# Patient Record
Sex: Male | Born: 1935 | Race: Black or African American | Hispanic: No | Marital: Married | Smoking: Former smoker
Health system: Southern US, Community
[De-identification: ages and names within clinical notes are randomized; demographics above are authoritative.]

## PROBLEM LIST (undated history)

## (undated) DIAGNOSIS — E119 Type 2 diabetes mellitus without complications: Secondary | ICD-10-CM

## (undated) DIAGNOSIS — I509 Heart failure, unspecified: Secondary | ICD-10-CM

## (undated) DIAGNOSIS — D649 Anemia, unspecified: Secondary | ICD-10-CM

## (undated) DIAGNOSIS — K635 Polyp of colon: Secondary | ICD-10-CM

## (undated) DIAGNOSIS — J9610 Chronic respiratory failure, unspecified whether with hypoxia or hypercapnia: Secondary | ICD-10-CM

## (undated) DIAGNOSIS — J449 Chronic obstructive pulmonary disease, unspecified: Secondary | ICD-10-CM

## (undated) HISTORY — PX: COLONOSCOPY: SHX174

---

## 2014-03-11 ENCOUNTER — Encounter (HOSPITAL_COMMUNITY): Payer: Self-pay | Admitting: Pulmonary Disease

## 2014-03-11 ENCOUNTER — Inpatient Hospital Stay (HOSPITAL_COMMUNITY): Payer: Medicare Other

## 2014-03-11 ENCOUNTER — Inpatient Hospital Stay (HOSPITAL_COMMUNITY)
Admission: EM | Admit: 2014-03-11 | Discharge: 2014-03-19 | DRG: 208 | Disposition: A | Discharge status: Home Health | Payer: Medicare Other | Source: Other Acute Inpatient Hospital | Attending: Internal Medicine | Admitting: Internal Medicine

## 2014-03-11 DIAGNOSIS — K219 Gastro-esophageal reflux disease without esophagitis: Secondary | ICD-10-CM | POA: Diagnosis present

## 2014-03-11 DIAGNOSIS — I129 Hypertensive chronic kidney disease with stage 1 through stage 4 chronic kidney disease, or unspecified chronic kidney disease: Secondary | ICD-10-CM | POA: Diagnosis present

## 2014-03-11 DIAGNOSIS — I5032 Chronic diastolic (congestive) heart failure: Secondary | ICD-10-CM | POA: Diagnosis present

## 2014-03-11 DIAGNOSIS — E876 Hypokalemia: Secondary | ICD-10-CM | POA: Diagnosis present

## 2014-03-11 DIAGNOSIS — E118 Type 2 diabetes mellitus with unspecified complications: Secondary | ICD-10-CM

## 2014-03-11 DIAGNOSIS — E785 Hyperlipidemia, unspecified: Secondary | ICD-10-CM | POA: Diagnosis present

## 2014-03-11 DIAGNOSIS — E662 Morbid (severe) obesity with alveolar hypoventilation: Secondary | ICD-10-CM | POA: Diagnosis present

## 2014-03-11 DIAGNOSIS — E119 Type 2 diabetes mellitus without complications: Secondary | ICD-10-CM

## 2014-03-11 DIAGNOSIS — E861 Hypovolemia: Secondary | ICD-10-CM | POA: Diagnosis present

## 2014-03-11 DIAGNOSIS — Z87891 Personal history of nicotine dependence: Secondary | ICD-10-CM

## 2014-03-11 DIAGNOSIS — J40 Bronchitis, not specified as acute or chronic: Secondary | ICD-10-CM | POA: Diagnosis present

## 2014-03-11 DIAGNOSIS — E1122 Type 2 diabetes mellitus with diabetic chronic kidney disease: Secondary | ICD-10-CM

## 2014-03-11 DIAGNOSIS — N472 Paraphimosis: Secondary | ICD-10-CM | POA: Diagnosis present

## 2014-03-11 DIAGNOSIS — M109 Gout, unspecified: Secondary | ICD-10-CM | POA: Diagnosis present

## 2014-03-11 DIAGNOSIS — Z9981 Dependence on supplemental oxygen: Secondary | ICD-10-CM | POA: Diagnosis not present

## 2014-03-11 DIAGNOSIS — G629 Polyneuropathy, unspecified: Secondary | ICD-10-CM | POA: Diagnosis present

## 2014-03-11 DIAGNOSIS — G9340 Encephalopathy, unspecified: Secondary | ICD-10-CM | POA: Diagnosis present

## 2014-03-11 DIAGNOSIS — D649 Anemia, unspecified: Secondary | ICD-10-CM | POA: Diagnosis present

## 2014-03-11 DIAGNOSIS — G4733 Obstructive sleep apnea (adult) (pediatric): Secondary | ICD-10-CM | POA: Diagnosis present

## 2014-03-11 DIAGNOSIS — J449 Chronic obstructive pulmonary disease, unspecified: Secondary | ICD-10-CM

## 2014-03-11 DIAGNOSIS — R0602 Shortness of breath: Secondary | ICD-10-CM | POA: Diagnosis present

## 2014-03-11 DIAGNOSIS — N189 Chronic kidney disease, unspecified: Secondary | ICD-10-CM | POA: Diagnosis present

## 2014-03-11 DIAGNOSIS — G9341 Metabolic encephalopathy: Secondary | ICD-10-CM | POA: Diagnosis present

## 2014-03-11 DIAGNOSIS — J9620 Acute and chronic respiratory failure, unspecified whether with hypoxia or hypercapnia: Secondary | ICD-10-CM

## 2014-03-11 DIAGNOSIS — E1140 Type 2 diabetes mellitus with diabetic neuropathy, unspecified: Secondary | ICD-10-CM | POA: Diagnosis present

## 2014-03-11 DIAGNOSIS — R5381 Other malaise: Secondary | ICD-10-CM

## 2014-03-11 DIAGNOSIS — I509 Heart failure, unspecified: Secondary | ICD-10-CM

## 2014-03-11 DIAGNOSIS — J9621 Acute and chronic respiratory failure with hypoxia: Secondary | ICD-10-CM | POA: Diagnosis present

## 2014-03-11 HISTORY — DX: Type 2 diabetes mellitus without complications: E11.9

## 2014-03-11 HISTORY — DX: Anemia, unspecified: D64.9

## 2014-03-11 HISTORY — DX: Heart failure, unspecified: I50.9

## 2014-03-11 HISTORY — DX: Chronic obstructive pulmonary disease, unspecified: J44.9

## 2014-03-11 HISTORY — DX: Polyp of colon: K63.5

## 2014-03-11 HISTORY — DX: Chronic respiratory failure, unspecified whether with hypoxia or hypercapnia: J96.10

## 2014-03-11 LAB — URINALYSIS, ROUTINE W REFLEX MICROSCOPIC
Bilirubin Urine: NEGATIVE
Glucose, UA: NEGATIVE mg/dL
HGB URINE DIPSTICK: NEGATIVE
Ketones, ur: NEGATIVE mg/dL
Leukocytes, UA: NEGATIVE
NITRITE: NEGATIVE
PH: 8 (ref 5.0–8.0)
Protein, ur: NEGATIVE mg/dL
SPECIFIC GRAVITY, URINE: 1.009 (ref 1.005–1.030)
Urobilinogen, UA: 1 mg/dL (ref 0.0–1.0)

## 2014-03-11 LAB — COMPREHENSIVE METABOLIC PANEL
ALT: 5 U/L (ref 0–53)
AST: 15 U/L (ref 0–37)
Albumin: 2.6 g/dL — ABNORMAL LOW (ref 3.5–5.2)
Alkaline Phosphatase: 94 U/L (ref 39–117)
Anion gap: 11 (ref 5–15)
BUN: 25 mg/dL — ABNORMAL HIGH (ref 6–23)
CO2: 33 mEq/L — ABNORMAL HIGH (ref 19–32)
Calcium: 8.9 mg/dL (ref 8.4–10.5)
Chloride: 100 mEq/L (ref 96–112)
Creatinine, Ser: 2.68 mg/dL — ABNORMAL HIGH (ref 0.50–1.35)
GFR calc Af Amer: 25 mL/min — ABNORMAL LOW (ref >90)
GFR calc non Af Amer: 22 mL/min — ABNORMAL LOW (ref >90)
Glucose, Bld: 90 mg/dL (ref 70–99)
Potassium: 3.1 mEq/L — ABNORMAL LOW (ref 3.7–5.3)
Sodium: 144 mEq/L (ref 137–147)
Total Bilirubin: 0.4 mg/dL (ref 0.3–1.2)
Total Protein: 7.3 g/dL (ref 6.0–8.3)

## 2014-03-11 LAB — POCT I-STAT 3, ART BLOOD GAS (G3+)
Acid-Base Excess: 11 mmol/L — ABNORMAL HIGH (ref 0.0–2.0)
BICARBONATE: 35 meq/L — AB (ref 20.0–24.0)
O2 Saturation: 95 %
PCO2 ART: 43.1 mmHg (ref 35.0–45.0)
PH ART: 7.515 — AB (ref 7.350–7.450)
TCO2: 36 mmol/L (ref 0–100)
pO2, Arterial: 67 mmHg — ABNORMAL LOW (ref 80.0–100.0)

## 2014-03-11 LAB — PHOSPHORUS
PHOSPHORUS: 1.9 mg/dL — AB (ref 2.3–4.6)
PHOSPHORUS: 1.6 mg/dL — AB (ref 2.3–4.6)

## 2014-03-11 LAB — BASIC METABOLIC PANEL
Anion gap: 10 (ref 5–15)
BUN: 24 mg/dL — AB (ref 6–23)
CALCIUM: 8.5 mg/dL (ref 8.4–10.5)
CO2: 32 mEq/L (ref 19–32)
Chloride: 101 mEq/L (ref 96–112)
Creatinine, Ser: 2.45 mg/dL — ABNORMAL HIGH (ref 0.50–1.35)
GFR, EST AFRICAN AMERICAN: 28 mL/min — AB (ref >90)
GFR, EST NON AFRICAN AMERICAN: 24 mL/min — AB (ref >90)
Glucose, Bld: 116 mg/dL — ABNORMAL HIGH (ref 70–99)
Potassium: 3 mEq/L — ABNORMAL LOW (ref 3.7–5.3)
SODIUM: 143 meq/L (ref 137–147)

## 2014-03-11 LAB — MRSA PCR SCREENING: MRSA by PCR: NEGATIVE

## 2014-03-11 LAB — CBC
HCT: 27.6 % — ABNORMAL LOW (ref 39.0–52.0)
Hemoglobin: 7.7 g/dL — ABNORMAL LOW (ref 13.0–17.0)
MCH: 26.4 pg (ref 26.0–34.0)
MCHC: 27.9 g/dL — ABNORMAL LOW (ref 30.0–36.0)
MCV: 94.5 fL (ref 78.0–100.0)
Platelets: 298 10*3/uL (ref 150–400)
RBC: 2.92 MIL/uL — ABNORMAL LOW (ref 4.22–5.81)
RDW: 16.3 % — ABNORMAL HIGH (ref 11.5–15.5)
WBC: 8.7 10*3/uL (ref 4.0–10.5)

## 2014-03-11 LAB — GLUCOSE, CAPILLARY
GLUCOSE-CAPILLARY: 99 mg/dL (ref 70–99)
GLUCOSE-CAPILLARY: 106 mg/dL — AB (ref 70–99)
Glucose-Capillary: 91 mg/dL (ref 70–99)
Glucose-Capillary: 87 mg/dL (ref 70–99)
Glucose-Capillary: 124 mg/dL — ABNORMAL HIGH (ref 70–99)

## 2014-03-11 LAB — PRO B NATRIURETIC PEPTIDE: Pro B Natriuretic peptide (BNP): 2750 pg/mL — ABNORMAL HIGH (ref 0–450)

## 2014-03-11 LAB — PROTIME-INR
INR: 1.26 (ref 0.00–1.49)
Prothrombin Time: 15.8 seconds — ABNORMAL HIGH (ref 11.6–15.2)

## 2014-03-11 LAB — MAGNESIUM
MAGNESIUM: 1.6 mg/dL (ref 1.5–2.5)
Magnesium: 1.7 mg/dL (ref 1.5–2.5)

## 2014-03-11 LAB — TRIGLYCERIDES: TRIGLYCERIDES: 90 mg/dL (ref <150)

## 2014-03-11 MED ORDER — IPRATROPIUM-ALBUTEROL 0.5-2.5 (3) MG/3ML IN SOLN
3.0000 mL | Freq: Four times a day (QID) | RESPIRATORY_TRACT | Status: DC
  Administered 2014-03-11 – 2014-03-19 (×34): 3 mL via RESPIRATORY_TRACT
  Filled 2014-03-11 (×34): qty 3

## 2014-03-11 MED ORDER — PROPOFOL 10 MG/ML IV EMUL
0.0000 ug/kg/min | INTRAVENOUS | Status: DC
  Administered 2014-03-11: 10 ug/kg/min via INTRAVENOUS
  Administered 2014-03-11: 50 ug/kg/min via INTRAVENOUS
  Administered 2014-03-12: 10 ug/kg/min via INTRAVENOUS
  Administered 2014-03-12: 15 ug/kg/min via INTRAVENOUS
  Administered 2014-03-12: 10 ug/kg/min via INTRAVENOUS
  Administered 2014-03-13 (×4): 30 ug/kg/min via INTRAVENOUS
  Administered 2014-03-14 – 2014-03-15 (×3): 15 ug/kg/min via INTRAVENOUS
  Filled 2014-03-11 (×5): qty 100
  Filled 2014-03-11: qty 200
  Filled 2014-03-11 (×4): qty 100
  Filled 2014-03-11: qty 200
  Filled 2014-03-11 (×2): qty 100

## 2014-03-11 MED ORDER — VITAL HIGH PROTEIN PO LIQD
1000.0000 mL | ORAL | Status: DC
  Administered 2014-03-11 – 2014-03-12 (×2): 1000 mL
  Filled 2014-03-11 (×2): qty 1000

## 2014-03-11 MED ORDER — INFLUENZA VAC SPLIT QUAD 0.5 ML IM SUSY
0.5000 mL | PREFILLED_SYRINGE | INTRAMUSCULAR | Status: DC
  Filled 2014-03-11: qty 0.5

## 2014-03-11 MED ORDER — SODIUM CHLORIDE 0.9 % IJ SOLN
3.0000 mL | INTRAMUSCULAR | Status: DC | PRN
  Administered 2014-03-16: 3 mL via INTRAVENOUS

## 2014-03-11 MED ORDER — PANTOPRAZOLE SODIUM 40 MG IV SOLR
40.0000 mg | Freq: Every day | INTRAVENOUS | Status: DC
  Administered 2014-03-11 – 2014-03-12 (×2): 40 mg via INTRAVENOUS
  Filled 2014-03-11 (×3): qty 40

## 2014-03-11 MED ORDER — SODIUM CHLORIDE 0.9 % IV SOLN: 250.0000 mL | INTRAVENOUS | Status: DC | PRN

## 2014-03-11 MED ORDER — FUROSEMIDE 10 MG/ML IJ SOLN
80.0000 mg | Freq: Once | INTRAMUSCULAR | Status: AC
  Administered 2014-03-11: 80 mg via INTRAVENOUS
  Filled 2014-03-11: qty 8

## 2014-03-11 MED ORDER — PRO-STAT SUGAR FREE PO LIQD
30.0000 mL | Freq: Two times a day (BID) | ORAL | Status: DC
  Administered 2014-03-11: 30 mL
  Filled 2014-03-11 (×3): qty 30

## 2014-03-11 MED ORDER — SODIUM CHLORIDE 0.9 % IJ SOLN
3.0000 mL | Freq: Two times a day (BID) | INTRAMUSCULAR | Status: DC
  Administered 2014-03-11 – 2014-03-19 (×13): 3 mL via INTRAVENOUS

## 2014-03-11 MED ORDER — HEPARIN SODIUM (PORCINE) 5000 UNIT/ML IJ SOLN
5000.0000 [IU] | Freq: Three times a day (TID) | INTRAMUSCULAR | Status: DC
  Administered 2014-03-11 – 2014-03-19 (×25): 5000 [IU] via SUBCUTANEOUS
  Filled 2014-03-11 (×27): qty 1

## 2014-03-11 MED ORDER — ALBUTEROL SULFATE (2.5 MG/3ML) 0.083% IN NEBU: 2.5000 mg | INHALATION_SOLUTION | RESPIRATORY_TRACT | Status: DC | PRN

## 2014-03-11 MED ORDER — CETYLPYRIDINIUM CHLORIDE 0.05 % MT LIQD
7.0000 mL | Freq: Four times a day (QID) | OROMUCOSAL | Status: DC
  Administered 2014-03-11 – 2014-03-16 (×19): 7 mL via OROMUCOSAL

## 2014-03-11 MED ORDER — PNEUMOCOCCAL VAC POLYVALENT 25 MCG/0.5ML IJ INJ
0.5000 mL | INJECTION | INTRAMUSCULAR | Status: DC
  Filled 2014-03-11: qty 0.5

## 2014-03-11 MED ORDER — FENTANYL CITRATE 0.05 MG/ML IJ SOLN
50.0000 ug | INTRAMUSCULAR | Status: DC | PRN
  Administered 2014-03-11 – 2014-03-14 (×7): 50 ug via INTRAVENOUS
  Filled 2014-03-11 (×7): qty 2

## 2014-03-11 MED ORDER — CHLORHEXIDINE GLUCONATE 0.12 % MT SOLN
15.0000 mL | Freq: Two times a day (BID) | OROMUCOSAL | Status: DC
  Administered 2014-03-11 – 2014-03-15 (×10): 15 mL via OROMUCOSAL
  Filled 2014-03-11 (×9): qty 15

## 2014-03-11 NOTE — Progress Notes (Signed)
INITIAL NUTRITION ASSESSMENT  DOCUMENTATION CODES Per approved criteria  -Obesity Unspecified   INTERVENTION:  TF Recommendation: Utilize 82M PEPuP Protocol: initiate TF via OGT with Vital HP at 25 ml/h and Prostat 30 ml BID on day 1; on day 2, increase to goal rate of 45 ml/h (1080 ml per day) and increase Prostat to 60 ml BID to provide 1480 kcals, 155 gm protein (100% of estimated needs), 903 ml free water daily.  TF plus Propofol will provide a total of 1649 kcals per day (88% of estimated needs).  Will continue to monitor  NUTRITION DIAGNOSIS: Inadequate oral intake related to inability to eat as evidenced by NPO status.  Goal: Enteral nutrition to provide 60-70% of estimated calorie needs (22-25 kcals/kg ideal body weight) and 100% of estimated protein needs, based on ASPEN guidelines for permissive underfeeding in critically ill obese individuals  Monitor:  TF regimen & tolerance, respiratory status, weight, labs, I/O's  Reason for Assessment: Mechanical Ventilator   Admitting Dx: <principal problem not specified>  ASSESSMENT: 78 y.o. M with PMH of COPD, HTN, CHF and who [facility] he presented for SOB. He was placed on BiPAP but failed and subsequently required intubation.  Per H&P, TF will be initiated if pt remains NPO >24 hours.  Patient is currently intubated on ventilator support d/t increasing lethargy. MV: 6.3 L/min Temp (24hrs), Avg:98.1 F (36.7 C), Min:97.6 F (36.4 C), Max:98.5 F (36.9 C)  Propofol: 6.4 ml/hr - providing 169 fat kcal  Nutrition focused physical exam shows no sign of depletion of muscle mass or body fat.  Labs reviewed: Low K High BUN & Creatinine  Height: Ht Readings from Last 1 Encounters:  03/11/14 5\' 9"  (1.753 m)    Weight: Wt Readings from Last 1 Encounters:  03/11/14 236 lb 5.3 oz (107.2 kg)    Ideal Body Weight: 160 lb  % Ideal Body Weight: 148%  Wt Readings from  Last 10 Encounters:  03/11/14 236 lb 5.3 oz (107.2 kg)    Usual Body Weight: NA  % Usual Body Weight: NA  BMI:  Body mass index is 34.88 kg/(m^2).  Estimated Nutritional Needs: Kcal: 1873 Protein: 150-160g Fluid: 1.8L/day  Skin: intact  Diet Order: NPO  EDUCATION NEEDS: -No education needs identified at this time   Intake/Output Summary (Last 24 hours) at 03/11/14 0940 Last data filed at 03/11/14 0800  Gross per 24 hour  Intake 104.61 ml  Output    400 ml  Net -295.39 ml    Last BM: UTA  Labs:   Recent Labs Lab 03/11/14 0800  NA 144  K 3.1*  CL 100  CO2 33*  BUN 25*  CREATININE 2.68*  CALCIUM 8.9  MG 1.7  PHOS 1.9*  GLUCOSE 90    CBG (last 3)   Recent Labs  03/11/14 0424 03/11/14 0756  GLUCAP 91 87    Scheduled Meds: . antiseptic oral rinse  7 mL Mouth Rinse QID  . chlorhexidine  15 mL Mouth Rinse BID  . heparin  5,000 Units Subcutaneous 3 times per day  . ipratropium-albuterol  3 mL Nebulization Q6H  . pantoprazole (PROTONIX) IV  40 mg Intravenous QHS  . sodium chloride  3 mL Intravenous Q12H    Continuous Infusions: . propofol 20 mcg/kg/min (03/11/14 0800)    Past Medical History  Diagnosis Date  . COPD (chronic obstructive pulmonary disease)   . Chronic respiratory failure   . CHF (congestive  heart failure)   . Diabetes mellitus   . Anemia   . Colon polyp     Past Surgical History  Procedure Laterality Date  . Colonoscopy      Tilda Franco, MS, RD, PLDN Provisionally Licensed Dietitian Nutritionist Pager: [phone]

## 2014-03-11 NOTE — H&P (Signed)
PULMONARY / CRITICAL CARE MEDICINE   Name: Douglas Rogers MRN: [id number] DOB: [birth year]    ADMISSION DATE:  03/11/2014 CONSULTATION DATE:  03/11/2014  REFERRING MD :  Douglas [facility]  CHIEF COMPLAINT:  SOB  INITIAL PRESENTATION: 78 y.o., taken to [facility] ED where he presented for SOB.  He was placed on BiPAP but failed and subsequently required intubation.  He was transferred to [facility] ICU and PCCM was consulted for admission.  STUDIES:  None  SIGNIFICANT EVENTS: 10/2 - presented to [facility] ED, intubated for respiratory failure, transferred to [facility].   HISTORY OF PRESENT ILLNESS:  Pt is encephalopathic; therefore, this HPI is obtained from chart review and d/w pt's daughter. Douglas Rogers is a 51 y.o. M with PMH of COPD, HTN, CHF and who presented to [facility] ED with SOB.  Per his wife, he had been more sleepy than usual for the past day.  Daughter states that pt has been getting progressively weaker over past few weeks.  He has had a problem with anemia and was recently started on EPO.  He wears 2 - 3 L O2 chronically.  Had recent colonoscopy. At [facility] ED, ABG revealed respiratory acidosis.  He was placed on BiPAP without much improvement.  Due to increasing lethargy, pt was subsequently intubated.  Request was made for transfer to [facility] ICU.  PAST MEDICAL HISTORY :  Past Medical History  Diagnosis Date  . COPD (chronic obstructive pulmonary disease)   . Chronic respiratory failure   . CHF (congestive heart failure)   . Diabetes mellitus   . Anemia   . Colon polyp    Past Surgical History  Procedure Laterality Date  . Colonoscopy     Prior to Admission medications   Not on File   No Known Allergies  FAMILY HISTORY:  Family History  Problem Relation Age of Onset  . Family history unknown: Yes   SOCIAL HISTORY:  reports that he has quit smoking. He does not have any smokeless tobacco history on file. His alcohol and drug histories are not on file.  REVIEW OF SYSTEMS:  Unable to  obtain as pt is encephalopathic.  SUBJECTIVE:   VITAL SIGNS: FiO2 (%):  [40 %] 40 % (10/02 0425) Weight:  [236 lb 5.3 oz (107.2 kg)] 236 lb 5.3 oz (107.2 kg) (10/02 0420) HEMODYNAMICS:   VENTILATOR SETTINGS: Vent Mode:  [-] PRVC FiO2 (%):  [40 %] 40 % Set Rate:  [20 bmp] 20 bmp Vt Set:  [550 mL] 550 mL PEEP:  [5 cmH20] 5 cmH20 Plateau Pressure:  [22 cmH20] 22 cmH20 INTAKE / OUTPUT: Intake/Output   None     PHYSICAL EXAMINATION: General: Obese male, sedated on vent. Neuro: Sedated. HEENT: [place]/AT. PERRL, sclerae anicteric. Cardiovascular: RRR, no M/R/G.  Lungs: Respirations even and unlabored.  CTA bilaterally, No W/R/R.  Abdomen: BS x 4, soft, NT/ND.  Musculoskeletal: No gross deformities, 1+ edema.  Skin: Intact, warm, no rashes.  LABS:  CBC No results found for this basename: WBC, HGB, HCT, PLT,  in the last 168 hours Coag's No results found for this basename: APTT, INR,  in the last 168 hours BMET No results found for this basename: NA, K, CL, CO2, BUN, CREATININE, GLUCOSE,  in the last 168 hours Electrolytes No results found for this basename: CALCIUM, MG, PHOS,  in the last 168 hours Sepsis Markers No results found for this basename: LATICACIDVEN, PROCALCITON, O2SATVEN,  in the last 168 hours ABG No results found for this basename: PHART, PCO2ART,  PO2ART,  in the last 168 hours Liver Enzymes No results found for this basename: AST, ALT, ALKPHOS, BILITOT, ALBUMIN,  in the last 168 hours Cardiac Enzymes No results found for this basename: TROPONINI, PROBNP,  in the last 168 hours Glucose No results found for this basename: GLUCAP,  in the last 168 hours  Imaging No results found.  Labs, CXR reviewed from outside hospital.  ASSESSMENT / PLAN:  PULMONARY OETT 10/2 >>> A: Acute on chronic hypercarbic and hypoxemic respiratory failure COPD without evidence of exacerbation - reported, no documentation / PFT's available. P:   Full mechanical support, wean  as able. VAP bundle. SBT in AM. DuoNebs / Albuterol. ABG and CXR now.  CARDIOVASCULAR A:  Reported hx HTN, CHF - unknown last echo or outpatient medications (daughter is working on getting med list). P:  ProBNP. Echo.  RENAL A:   No acute issues P:   NS @ 100. CMP now.  GASTROINTESTINAL A:   Nutrition GI prophylaxis P:   NPO. TF if remains NPO > 24 hours. SUP: Pantoprazole.  HEMATOLOGIC A:   VTE Prophylaxis Anemia - chronic per report from daughter. P:  SCD's / Heparin. Transfuse for Hgb < 8. CBC now.  INFECTIOUS A:   No obvious sign of infection P:   UCx 10/2 No role abx at this point. Monitor clinically.  ENDOCRINE A:   Hx of DM P:   SSI for glucose > 180.  NEUROLOGIC A:   Acute metabolic encephalopathy P:   Sedation:  Propofol gtt / Fentanyl PRN. RASS goal: 0 to -1. Daily WUA.   TODAY'S SUMMARY: 78 [place]o. M [place] [facility] [place] intubated for AoC hypercarbic and hypoxic respiratory failure.  Will get echo while here for reported CHF.  Daughter is working on getting pt's outpatient medication list.   Douglas Rogers, [place]PA - [facility] [place] [facility] Pgr: [phone]  or [phone] 03/11/2014, 4:48 AM  Reviewed above, examined.  78 yo male with acute on chronic hypoxic/hypercapnic respiratory failure in setting of COPD, CHF, and presumed OSA/OHS.  No evidence for infection or bronchospasm >> defer Abx and steroids for now.  Continue full vent support, f/u CXR and ABG, f/u Echo.  Updated pt's daughter at bedside.  CC time 40 minutes.  Douglas HellingVineet Leandra Vanderweele, MD [facility] Pulmonary/Critical Care 03/11/2014, 4:51 AM Pager:  [phone] After 3pm call: [phone]

## 2014-03-11 NOTE — Progress Notes (Signed)
Echocardiogram 2D Echocardiogram has been performed.  Douglas Rogers 03/11/2014, 4:46 PM

## 2014-03-11 NOTE — Progress Notes (Signed)
Utilization review completed. Rogue Rafalski, RN, BSN. 

## 2014-03-11 NOTE — Progress Notes (Signed)
eLink Physician-Brief Progress Note Patient Name: Tamera StandsLeroy Meador DOB: 1935/07/11 MRN: [id number]   Date of Service  03/11/2014  HPI/Events of Note  Hypoxemic respiratory failure CXR, proBNP reviewed Cr 2.68  eICU Interventions  Repeat BMET now and in AM Lasix now Start tube feedings     Intervention Category Major Interventions: Respiratory failure - evaluation and management Intermediate Interventions: Hypovolemia - evaluation and management  Tynasia Mccaul 03/11/2014, 7:22 PM

## 2014-03-11 NOTE — Progress Notes (Signed)
Note/chart reviewed. Agree with note.   Nykeria Mealing RD, LDN, CNSC [phone] Pager [phone] After Hours Pager   

## 2014-03-12 ENCOUNTER — Inpatient Hospital Stay (HOSPITAL_COMMUNITY): Payer: Medicare Other

## 2014-03-12 DIAGNOSIS — N189 Chronic kidney disease, unspecified: Secondary | ICD-10-CM

## 2014-03-12 DIAGNOSIS — I5032 Chronic diastolic (congestive) heart failure: Secondary | ICD-10-CM

## 2014-03-12 DIAGNOSIS — E1122 Type 2 diabetes mellitus with diabetic chronic kidney disease: Secondary | ICD-10-CM

## 2014-03-12 LAB — BLOOD GAS, ARTERIAL
ACID-BASE EXCESS: 8.1 mmol/L — AB (ref 0.0–2.0)
BICARBONATE: 32.2 meq/L — AB (ref 20.0–24.0)
Drawn by: 252031
FIO2: 0.4 %
O2 Saturation: 96.6 %
PEEP: 5 cmH2O
PH ART: 7.443 (ref 7.350–7.450)
Patient temperature: 100.3
RATE: 12 resp/min
TCO2: 33.6 mmol/L (ref 0–100)
VT: 550 mL
pCO2 arterial: 48.3 mmHg — ABNORMAL HIGH (ref 35.0–45.0)
pO2, Arterial: 80.9 mmHg (ref 80.0–100.0)

## 2014-03-12 LAB — CBC
HEMATOCRIT: 28.1 % — AB (ref 39.0–52.0)
HEMOGLOBIN: 8.1 g/dL — AB (ref 13.0–17.0)
MCH: 25.8 pg — ABNORMAL LOW (ref 26.0–34.0)
MCHC: 28.8 g/dL — ABNORMAL LOW (ref 30.0–36.0)
MCV: 89.5 fL (ref 78.0–100.0)
Platelets: 298 10*3/uL (ref 150–400)
RBC: 3.14 MIL/uL — ABNORMAL LOW (ref 4.22–5.81)
RDW: 17.6 % — ABNORMAL HIGH (ref 11.5–15.5)
WBC: 9.6 10*3/uL (ref 4.0–10.5)

## 2014-03-12 LAB — URINE CULTURE
Colony Count: NO GROWTH
Culture: NO GROWTH

## 2014-03-12 LAB — BASIC METABOLIC PANEL
ANION GAP: 10 (ref 5–15)
BUN: 26 mg/dL — ABNORMAL HIGH (ref 6–23)
CHLORIDE: 98 meq/L (ref 96–112)
CO2: 32 meq/L (ref 19–32)
Calcium: 8.7 mg/dL (ref 8.4–10.5)
Creatinine, Ser: 2.38 mg/dL — ABNORMAL HIGH (ref 0.50–1.35)
GFR calc Af Amer: 29 mL/min — ABNORMAL LOW (ref >90)
GFR calc non Af Amer: 25 mL/min — ABNORMAL LOW (ref >90)
GLUCOSE: 156 mg/dL — AB (ref 70–99)
Potassium: 3 mEq/L — ABNORMAL LOW (ref 3.7–5.3)
Sodium: 140 mEq/L (ref 137–147)

## 2014-03-12 LAB — GLUCOSE, CAPILLARY
GLUCOSE-CAPILLARY: 146 mg/dL — AB (ref 70–99)
GLUCOSE-CAPILLARY: 197 mg/dL — AB (ref 70–99)
Glucose-Capillary: 154 mg/dL — ABNORMAL HIGH (ref 70–99)
Glucose-Capillary: 173 mg/dL — ABNORMAL HIGH (ref 70–99)
Glucose-Capillary: 173 mg/dL — ABNORMAL HIGH (ref 70–99)
Glucose-Capillary: 184 mg/dL — ABNORMAL HIGH (ref 70–99)

## 2014-03-12 LAB — MAGNESIUM: MAGNESIUM: 1.5 mg/dL (ref 1.5–2.5)

## 2014-03-12 LAB — PHOSPHORUS: Phosphorus: 2.1 mg/dL — ABNORMAL LOW (ref 2.3–4.6)

## 2014-03-12 MED ORDER — PRO-STAT SUGAR FREE PO LIQD
60.0000 mL | Freq: Two times a day (BID) | ORAL | Status: DC
Start: 2014-03-12 — End: 2014-03-15
  Administered 2014-03-12 – 2014-03-15 (×7): 60 mL
  Filled 2014-03-12 (×8): qty 60

## 2014-03-12 MED ORDER — MAGNESIUM SULFATE IN D5W 10-5 MG/ML-% IV SOLN
1.0000 g | Freq: Once | INTRAVENOUS | Status: AC
  Administered 2014-03-12: 1 g via INTRAVENOUS
  Filled 2014-03-12: qty 100

## 2014-03-12 MED ORDER — INSULIN ASPART 100 UNIT/ML ~~LOC~~ SOLN
0.0000 [IU] | SUBCUTANEOUS | Status: DC
  Administered 2014-03-12 (×2): 3 [IU] via SUBCUTANEOUS
  Administered 2014-03-12: 4 [IU] via SUBCUTANEOUS
  Administered 2014-03-12: 3 [IU] via SUBCUTANEOUS
  Administered 2014-03-13: 4 [IU] via SUBCUTANEOUS
  Administered 2014-03-13: 3 [IU] via SUBCUTANEOUS
  Administered 2014-03-13: 4 [IU] via SUBCUTANEOUS
  Administered 2014-03-13: 3 [IU] via SUBCUTANEOUS
  Administered 2014-03-13: 7 [IU] via SUBCUTANEOUS
  Administered 2014-03-13: 4 [IU] via SUBCUTANEOUS
  Administered 2014-03-13 – 2014-03-14 (×2): 3 [IU] via SUBCUTANEOUS
  Administered 2014-03-14: 7 [IU] via SUBCUTANEOUS
  Administered 2014-03-14 (×3): 4 [IU] via SUBCUTANEOUS
  Administered 2014-03-15 (×5): 3 [IU] via SUBCUTANEOUS
  Administered 2014-03-16: 4 [IU] via SUBCUTANEOUS
  Administered 2014-03-16 – 2014-03-17 (×2): 3 [IU] via SUBCUTANEOUS

## 2014-03-12 MED ORDER — VITAL HIGH PROTEIN PO LIQD
1000.0000 mL | ORAL | Status: DC
Start: 2014-03-12 — End: 2014-03-15
  Administered 2014-03-13 – 2014-03-15 (×3): 1000 mL
  Filled 2014-03-12 (×4): qty 1000

## 2014-03-12 MED ORDER — ASPIRIN 81 MG PO CHEW
81.0000 mg | CHEWABLE_TABLET | Freq: Every day | ORAL | Status: DC
  Administered 2014-03-12 – 2014-03-19 (×8): 81 mg
  Filled 2014-03-12 (×8): qty 1

## 2014-03-12 MED ORDER — POTASSIUM PHOSPHATES 15 MMOLE/5ML IV SOLN
20.0000 mmol | Freq: Once | INTRAVENOUS | Status: AC
  Administered 2014-03-12: 20 mmol via INTRAVENOUS
  Filled 2014-03-12: qty 6.67

## 2014-03-12 NOTE — Progress Notes (Signed)
Pt states, via head nod that he has already had pneumonia vac but not his flu shot.  Pt declines both at this time.  Pt wants shots to be reviewed with his daughter [name]/family members prior to administration.  Medication time extended to allow for family to be at bedside to review

## 2014-03-12 NOTE — Progress Notes (Signed)
NUTRITION FOLLOW-UP/CONSULT  DOCUMENTATION CODES Per approved criteria  -Obesity Unspecified   INTERVENTION:  Utilize 67M PEPuP Protocol: initiate TF via OGT with Vital HP at 25 ml/h and Prostat 30 ml BID on day 1; on day 2, increase to goal rate of 45 ml/h (1080 ml per day) and increase Prostat to 60 ml BID to provide 1480 kcals, 155 gm protein (100% of estimated needs), 903 ml free water daily.  TF plus Propofol will provide a total of 1649 kcals per day (23 kcal/kg IBW).  Will continue to monitor  NUTRITION DIAGNOSIS: Inadequate oral intake related to inability to eat as evidenced by NPO status. Ongoing.  Goal: Enteral nutrition to provide 60-70% of estimated calorie needs (22-25 kcals/kg ideal body weight) and 100% of estimated protein needs, based on ASPEN guidelines for permissive underfeeding in critically ill obese individuals. Unmet.  Monitor:  TF regimen & tolerance, respiratory status, weight, labs, I/O's  ASSESSMENT: 78 y.o. M with PMH of COPD, HTN, CHF and who [facility]. [facility] where he presented for SOB. He was placed on BiPAP but failed and subsequently required intubation.  Patient started on enteral nutrition last night (10/2). Currently receiving Vital High Protein at 40 ml/hr. RD consulted to manage enteral feedings.  Patient is currently intubated on ventilator support d/t increasing lethargy. MV: 8.5 L/min Temp (24hrs), Avg:99.4 F (37.4 C), Min:98.5 F (36.9 C), Max:100.8 F (38.2 C)  Propofol: 6.4 ml/hr - providing 169 fat kcal  Nutrition focused physical exam shows no sign of depletion of muscle mass or body fat.  Labs reviewed: Low potassium Low phosphorus Magnesium WNL CBG's: 124 - 173 Triglycerides WNL Hgb low at 8.1 Hct low at 28.1   Height: Ht Readings from Last 1 Encounters:  03/11/14 5\' 9"  (1.753 m)    Weight: Wt Readings from Last 1 Encounters:  03/12/14 231 lb 0.7 oz (104.8 kg)  Admit wt 236  lb on 10/2 --> wt trending down likely 2/2 lasix  BMI:  Body mass index is 34.1 kg/(m^2). Obese Class I  Estimated Nutritional Needs: Kcal: 1844 Permissive underfeeding kcal goal: 1599 - 1817 Protein: at least 145 grams daily Fluid: 1.8 - 2 liters per day  Skin: intact; +1 generalized edema  Diet Order: NPO    Intake/Output Summary (Last 24 hours) at 03/12/14 0713 Last data filed at 03/12/14 0600  Gross per 24 hour  Intake 639.32 ml  Output   2765 ml  Net -2125.68 ml    Last BM: PTA  Labs:   Recent Labs Lab 03/11/14 0800 03/11/14 1947 03/12/14 0252  NA 144 143 140  K 3.1* 3.0* 3.0*  CL 100 101 98  CO2 33* 32 32  BUN 25* 24* 26*  CREATININE 2.68* 2.45* 2.38*  CALCIUM 8.9 8.5 8.7  MG 1.7 1.6  --   PHOS 1.9* 1.6*  --   GLUCOSE 90 116* 156*    CBG (last 3)   Recent Labs  03/11/14 2052 03/12/14 0010 03/12/14 0412  GLUCAP 124* 154* 173*   Triglycerides  Date/Time Value Ref Range Status  03/11/2014  8:00 AM 90  <150 mg/dL Final   CBC    Component Value Date/Time   WBC 9.6 03/12/2014 0252   RBC 3.14* 03/12/2014 0252   HGB 8.1* 03/12/2014 0252   HCT 28.1* 03/12/2014 0252   PLT 298 03/12/2014 0252   MCV 89.5 03/12/2014 0252   MCH 25.8* 03/12/2014 0252   MCHC 28.8* 03/12/2014 0252  RDW 17.6* 03/12/2014 0252     Scheduled Meds: . antiseptic oral rinse  7 mL Mouth Rinse QID  . chlorhexidine  15 mL Mouth Rinse BID  . feeding supplement (PRO-STAT SUGAR FREE 64)  30 mL Per Tube BID  . feeding supplement (VITAL HIGH PROTEIN)  1,000 mL Per Tube Q24H  . heparin  5,000 Units Subcutaneous 3 times per day  . Influenza vac split quadrivalent PF  0.5 mL Intramuscular Tomorrow-1000  . ipratropium-albuterol  3 mL Nebulization Q6H  . pantoprazole (PROTONIX) IV  40 mg Intravenous QHS  . pneumococcal 23 valent vaccine  0.5 mL Intramuscular Tomorrow-1000  . sodium chloride  3 mL Intravenous Q12H    Continuous Infusions: . propofol 10 mcg/kg/min (03/12/14 0140)     Jarold Motto MS, RD, LDN Inpatient Registered Dietitian After-hours pager: [phone]

## 2014-03-12 NOTE — Progress Notes (Signed)
PULMONARY / CRITICAL CARE MEDICINE   Name: Douglas Rogers MRN: [id number] DOB: 10-30-1935    ADMISSION DATE:  03/11/2014 CONSULTATION DATE:  03/12/2014  REFERRING MD :  [facility]  CHIEF COMPLAINT:  SOB  INITIAL PRESENTATION: 78 y.o., taken to [facility] ED where he presented for SOB.  He was placed on BiPAP but failed and subsequently required intubation.  He was transferred to [facility] ICU and PCCM was consulted for admission.  STUDIES:  None  SIGNIFICANT EVENTS: 10/2 - presented to [facility] ED, intubated for respiratory failure, transferred to [facility].  SUBJECTIVE:  Denies chest/abd pain.  C/o Rt shoulder pain.  Tolerating some pressure support.  VITAL SIGNS: Temp:  [98.5 F (36.9 C)-100.8 F (38.2 C)] 100.8 F (38.2 C) (10/03 0600) Pulse Rate:  [73-101] 87 (10/03 0600) Resp:  [12-28] 14 (10/03 0600) BP: (108-148)/(38-69) 119/41 mmHg (10/03 0600) SpO2:  [95 %-100 %] 98 % (10/03 0600) FiO2 (%):  [40 %] 40 % (10/03 0600) Weight:  [231 lb 0.7 oz (104.8 kg)] 231 lb 0.7 oz (104.8 kg) (10/03 0500) VENTILATOR SETTINGS: Vent Mode:  [-] PRVC FiO2 (%):  [40 %] 40 % Set Rate:  [12 bmp] 12 bmp Vt Set:  [550 mL] 550 mL PEEP:  [5 cmH20] 5 cmH20 Plateau Pressure:  [19 cmH20-21 cmH20] 19 cmH20 INTAKE / OUTPUT: Intake/Output     10/02 0701 - 10/03 0700 10/03 0701 - 10/04 0700   I.V. (mL/kg) 353.9 (3.4)    NG/GT 285.4    Total Intake(mL/kg) 639.3 (6.1)    Urine (mL/kg/hr) 2765 (1.1) 150 (1)   Total Output 2765 150   Net -2125.7 -150          PHYSICAL EXAMINATION: General: no distress Neuro: RASS 0, moves all extremities HEENT: ETT in place Cardiovascular: regular   Lungs: decreased breath sounds, no wheeze Abdomen: soft, non tender Musculoskeletal: ankle edema Skin:  no rashes  LABS:  CBC  Recent Labs Lab 03/11/14 0800 03/12/14 0252  WBC 8.7 9.6  HGB 7.7* 8.1*  HCT 27.6* 28.1*  PLT 298 298   Coag's  Recent Labs Lab 03/11/14 0800  INR 1.26   BMET  Recent Labs Lab  03/11/14 0800 03/11/14 1947 03/12/14 0252  NA 144 143 140  K 3.1* 3.0* 3.0*  CL 100 101 98  CO2 33* 32 32  BUN 25* 24* 26*  CREATININE 2.68* 2.45* 2.38*  GLUCOSE 90 116* 156*   Electrolytes  Recent Labs Lab 03/11/14 0800 03/11/14 1947 03/12/14 0252  CALCIUM 8.9 8.5 8.7  MG 1.7 1.6  --   PHOS 1.9* 1.6*  --    ABG  Recent Labs Lab 03/11/14 0452 03/12/14 0315  PHART 7.515* 7.443  PCO2ART 43.1 48.3*  PO2ART 67.0* 80.9   Liver Enzymes  Recent Labs Lab 03/11/14 0800  AST 15  ALT 5  ALKPHOS 94  BILITOT 0.4  ALBUMIN 2.6*   Cardiac Enzymes  Recent Labs Lab 03/11/14 0800  PROBNP 2750.0*   Glucose  Recent Labs Lab 03/11/14 1203 03/11/14 1550 03/11/14 2052 03/12/14 0010 03/12/14 0412 03/12/14 0728  GLUCAP 99 106* 124* 154* 173* 173*    Imaging Dg Chest Port 1 View  03/12/2014   CLINICAL DATA:  Acute respiratory failure.  EXAM: PORTABLE CHEST - 1 VIEW  COMPARISON:  03/11/2014  FINDINGS: Endotracheal tube has been retracted, with tip now approximately 3 cm above the carina. Enteric tube courses into the left upper abdomen with side hole projecting over the proximal stomach and tip not  imaged. There is persistent elevation of the right hemidiaphragm. Lung volumes remain low with coarse bilateral parenchymal opacities, not significantly changed. Linear atelectasis in the right lung base is also unchanged. No pleural effusion or pneumothorax is identified.  IMPRESSION: 1. Interval retraction of endotracheal tube as above. 2. Persistent low lung volumes with unchanged, course bilateral parenchymal opacities and right basilar atelectasis.   Electronically Signed   By: Sebastian AcheAllen  Grady   On: 03/12/2014 08:14   Dg Chest Port 1 View  03/11/2014   CLINICAL DATA:  Assess airspace.  EXAM: PORTABLE CHEST - 1 VIEW  COMPARISON:  None.  FINDINGS: An endotracheal tube is present just above the carina, with tip measuring 6 mm above the carina. Enteric tube tip is in the left upper  quadrant consistent with location in the upper stomach. Shallow inspiration with elevation of the right hemidiaphragm. Linear atelectasis in the left lung base. Coarse infiltrative changes in the lungs appears to be associated with bronchiectasis and likely represents bronchitic change. No focal consolidation or airspace disease. No blunting of costophrenic angles. No pneumothorax.  IMPRESSION: Endotracheal tube tip is just above the carinal. Linear atelectasis in the right lung base. Coarse parenchymal infiltrates associated bronchiectasis, likely representing bronchitic changes.  These results were called by telephone at the time of interpretation on 03/11/2014 at 5:17 am to [facility], nurse on ICU, who verbally acknowledged these results.   Electronically Signed   By: Burman NievesWilliam  Stevens M.D.   On: 03/11/2014 05:21    ASSESSMENT / PLAN:  PULMONARY OETT 10/2 >>> A: Acute on chronic hypercarbic and hypoxemic respiratory failure. Hx of COPD on home oxygen. Presumed OSA/OHS. P:   Pressure support wean as tolerated Will need CPAP/BiPAP after extubation F/u CXR intermittently Continue scheduled BD's for now  CARDIOVASCULAR A:  Chronic diastolic dysfunction with hx of HTN. Hyperlipidemia. P:  F/u Echo Monitor hemodynamics Will need to resume pravachol  Goal even to negative fluid balance ASA  RENAL A:   CKD. P:   Monitor urine outpt, renal fx, electrolytes Hold outpt diamox, lasix  GASTROINTESTINAL A:   Nutrition. Hx of GERD. P:   Tube feeds while on vent Protonix for SUP  HEMATOLOGIC A:   Anemia - chronic per report from daughter. P:  SCD's / Heparin. F/u CBC  INFECTIOUS A:   No obvious sign of infection P:   Monitor clinically  Urine cx 10/02 >>   ENDOCRINE A:   Hx of DM with renal complications and neuropathy. Hx of gout. P:   SSI Hold outpt januvia Hold outpt allopurinol  NEUROLOGIC A:   Acute metabolic encephalopathy DM neuropathy. P:   RASS goal:  0 Hold outpt neurontin  CC time 35 minutes.  Coralyn HellingVineet Ralynn San, MD [facility] Pulmonary/Critical Care 03/12/2014, 8:33 AM Pager:  [phone] After 3pm call: [phone]

## 2014-03-12 NOTE — Progress Notes (Signed)
Called [facility] and spoke with Nicki about patient's low K+.  She said she was reviewing labs and that he did not qualify for the replacement protocol.  Said she would notify physician of low K+.

## 2014-03-13 LAB — BASIC METABOLIC PANEL
Anion gap: 10 (ref 5–15)
BUN: 32 mg/dL — AB (ref 6–23)
CO2: 32 mEq/L (ref 19–32)
CREATININE: 2.26 mg/dL — AB (ref 0.50–1.35)
Calcium: 8.3 mg/dL — ABNORMAL LOW (ref 8.4–10.5)
Chloride: 99 mEq/L (ref 96–112)
GFR calc Af Amer: 31 mL/min — ABNORMAL LOW (ref >90)
GFR, EST NON AFRICAN AMERICAN: 26 mL/min — AB (ref >90)
Glucose, Bld: 144 mg/dL — ABNORMAL HIGH (ref 70–99)
POTASSIUM: 2.9 meq/L — AB (ref 3.7–5.3)
Sodium: 141 mEq/L (ref 137–147)

## 2014-03-13 LAB — GLUCOSE, CAPILLARY
GLUCOSE-CAPILLARY: 153 mg/dL — AB (ref 70–99)
GLUCOSE-CAPILLARY: 162 mg/dL — AB (ref 70–99)
Glucose-Capillary: 134 mg/dL — ABNORMAL HIGH (ref 70–99)
Glucose-Capillary: 148 mg/dL — ABNORMAL HIGH (ref 70–99)
Glucose-Capillary: 149 mg/dL — ABNORMAL HIGH (ref 70–99)
Glucose-Capillary: 191 mg/dL — ABNORMAL HIGH (ref 70–99)
Glucose-Capillary: 217 mg/dL — ABNORMAL HIGH (ref 70–99)

## 2014-03-13 LAB — CBC
HCT: 27 % — ABNORMAL LOW (ref 39.0–52.0)
Hemoglobin: 8 g/dL — ABNORMAL LOW (ref 13.0–17.0)
MCH: 25.7 pg — ABNORMAL LOW (ref 26.0–34.0)
MCHC: 29.6 g/dL — AB (ref 30.0–36.0)
MCV: 86.8 fL (ref 78.0–100.0)
PLATELETS: 282 10*3/uL (ref 150–400)
RBC: 3.11 MIL/uL — AB (ref 4.22–5.81)
RDW: 18.4 % — ABNORMAL HIGH (ref 11.5–15.5)
WBC: 10.7 10*3/uL — ABNORMAL HIGH (ref 4.0–10.5)

## 2014-03-13 LAB — PHOSPHORUS: Phosphorus: 3.6 mg/dL (ref 2.3–4.6)

## 2014-03-13 LAB — MAGNESIUM: MAGNESIUM: 1.9 mg/dL (ref 1.5–2.5)

## 2014-03-13 MED ORDER — DEXTROSE 5 % IV SOLN
500.0000 mg | INTRAVENOUS | Status: DC
  Administered 2014-03-13 – 2014-03-17 (×5): 500 mg via INTRAVENOUS
  Filled 2014-03-13 (×5): qty 500

## 2014-03-13 MED ORDER — PRAVASTATIN SODIUM 10 MG PO TABS
10.0000 mg | ORAL_TABLET | Freq: Every day | ORAL | Status: DC
  Administered 2014-03-13 – 2014-03-19 (×7): 10 mg
  Filled 2014-03-13 (×7): qty 1

## 2014-03-13 MED ORDER — MAGNESIUM SULFATE IN D5W 10-5 MG/ML-% IV SOLN
1.0000 g | Freq: Once | INTRAVENOUS | Status: AC
  Administered 2014-03-13: 1 g via INTRAVENOUS
  Filled 2014-03-13: qty 100

## 2014-03-13 MED ORDER — DEXTROSE 5 % IV SOLN
1.0000 g | INTRAVENOUS | Status: DC
  Administered 2014-03-13 – 2014-03-17 (×5): 1 g via INTRAVENOUS
  Filled 2014-03-13 (×5): qty 10

## 2014-03-13 MED ORDER — POTASSIUM CHLORIDE 20 MEQ/15ML (10%) PO LIQD
40.0000 meq | ORAL | Status: AC
  Administered 2014-03-13 (×2): 40 meq
  Filled 2014-03-13 (×2): qty 30

## 2014-03-13 MED ORDER — PANTOPRAZOLE SODIUM 40 MG PO PACK
40.0000 mg | PACK | ORAL | Status: DC
  Administered 2014-03-13 – 2014-03-14 (×2): 40 mg
  Filled 2014-03-13 (×4): qty 20

## 2014-03-13 NOTE — Progress Notes (Signed)
03/13/2014 3:17 AM  CRITICAL VALUE ALERT  Critical value received:  K + 2.9   Date of notification:  03/13/2014`  Time of notification:  3:18 AM  Critical value read back:Yes.    Nurse who received alert:  Carlyon ProwsShakiera Jinelle Butchko RN  MD notified (1st page):  Dr Deterding    Time of first page:  0319  MD notified (2nd page):  Time of second page:  Responding MD:  Dr Darrick Pennaeterding   Time MD responded:  [id number]

## 2014-03-13 NOTE — Progress Notes (Signed)
eLink Physician-Brief Progress Note Patient Name: Tamera StandsLeroy Hayman DOB: [birth year] MRN: [id number]   Date of Service  03/13/2014  HPI/Events of Note  Hypokalemia  eICU Interventions  Potassium replaced     Intervention Category Intermediate Interventions: Electrolyte abnormality - evaluation and management  Koa Zoeller 03/13/2014, 3:15 AM

## 2014-03-13 NOTE — Progress Notes (Signed)
PULMONARY / CRITICAL CARE MEDICINE   Name: Douglas Rogers MRN: [id number] DOB: Oct 23, 1935    ADMISSION DATE:  03/11/2014 CONSULTATION DATE:  03/13/2014  REFERRING MD :  [facility]  CHIEF COMPLAINT:  SOB  INITIAL PRESENTATION: 78 y.o., taken to [facility] ED where he presented for SOB.  He was placed on BiPAP but failed and subsequently required intubation.  He was transferred to [facility] ICU and PCCM was consulted for admission.  STUDIES:   SIGNIFICANT EVENTS: 10/2 presented to [facility] ED, intubated for respiratory failure, transferred to [facility] 10/4 fever, increased respiratory secretions  SUBJECTIVE:  Increased secretions.  Did not tolerate SBT.  VITAL SIGNS: Temp:  [98.8 F (37.1 C)-101.2 F (38.4 C)] 99.2 F (37.3 C) (10/04 1000) Pulse Rate:  [84-109] 94 (10/04 1000) Resp:  [12-22] 14 (10/04 1000) BP: (106-136)/(36-92) 124/56 mmHg (10/04 0900) SpO2:  [97 %-100 %] 100 % (10/04 1000) FiO2 (%):  [40 %] 40 % (10/04 1000) VENTILATOR SETTINGS: Vent Mode:  [-] PRVC FiO2 (%):  [40 %] 40 % Set Rate:  [12 bmp] 12 bmp Vt Set:  [550 mL] 550 mL PEEP:  [5 cmH20] 5 cmH20 Plateau Pressure:  [17 cmH20-27 cmH20] 27 cmH20 INTAKE / OUTPUT: Intake/Output     10/03 0701 - 10/04 0700 10/04 0701 - 10/05 0700   I.V. (mL/kg) 525.8 (5) 87.9 (0.8)   NG/GT 1090 130   IV Piggyback 336    Total Intake(mL/kg) 1951.8 (18.6) 217.9 (2.1)   Urine (mL/kg/hr) 1180 (0.5) 400 (1.1)   Total Output 1180 400   Net +771.8 -182.1          PHYSICAL EXAMINATION: General: no distress Neuro: RASS 0, moves all extremities HEENT: ETT in place Cardiovascular: regular   Lungs: decreased breath sounds, b/l rhonchi Abdomen: soft, non tender Musculoskeletal: ankle edema Skin:  no rashes  LABS:  CBC  Recent Labs Lab 03/11/14 0800 03/12/14 0252 03/13/14 0230  WBC 8.7 9.6 10.7*  HGB 7.7* 8.1* 8.0*  HCT 27.6* 28.1* 27.0*  PLT 298 298 282   Coag's  Recent Labs Lab 03/11/14 0800  INR 1.26   BMET  Recent  Labs Lab 03/11/14 1947 03/12/14 0252 03/13/14 0230  NA 143 140 141  K 3.0* 3.0* 2.9*  CL 101 98 99  CO2 32 32 32  BUN 24* 26* 32*  CREATININE 2.45* 2.38* 2.26*  GLUCOSE 116* 156* 144*   Electrolytes  Recent Labs Lab 03/11/14 1947 03/12/14 0252 03/12/14 0742 03/13/14 0230  CALCIUM 8.5 8.7  --  8.3*  MG 1.6  --  1.5 1.9  PHOS 1.6*  --  2.1* 3.6   ABG  Recent Labs Lab 03/11/14 0452 03/12/14 0315  PHART 7.515* 7.443  PCO2ART 43.1 48.3*  PO2ART 67.0* 80.9   Liver Enzymes  Recent Labs Lab 03/11/14 0800  AST 15  ALT 5  ALKPHOS 94  BILITOT 0.4  ALBUMIN 2.6*   Cardiac Enzymes  Recent Labs Lab 03/11/14 0800  PROBNP 2750.0*   Glucose  Recent Labs Lab 03/12/14 1120 03/12/14 1538 03/12/14 1925 03/12/14 2348 03/13/14 0348 03/13/14 0724  GLUCAP 184* 146* 197* 191* 134* 149*    Imaging Dg Chest Port 1 View  03/12/2014   CLINICAL DATA:  Acute respiratory failure.  EXAM: PORTABLE CHEST - 1 VIEW  COMPARISON:  03/11/2014  FINDINGS: Endotracheal tube has been retracted, with tip now approximately 3 cm above the carina. Enteric tube courses into the left upper abdomen with side hole projecting over the proximal stomach and  tip not imaged. There is persistent elevation of the right hemidiaphragm. Lung volumes remain low with coarse bilateral parenchymal opacities, not significantly changed. Linear atelectasis in the right lung base is also unchanged. No pleural effusion or pneumothorax is identified.  IMPRESSION: 1. Interval retraction of endotracheal tube as above. 2. Persistent low lung volumes with unchanged, course bilateral parenchymal opacities and right basilar atelectasis.   Electronically Signed   By: Sebastian AcheAllen  Grady   On: 03/12/2014 08:14    ASSESSMENT / PLAN:  PULMONARY OETT 10/2 >>> A: Acute on chronic hypercarbic and hypoxemic respiratory failure. Hx of COPD on home oxygen. Presumed OSA/OHS. P:   Pressure support wean as tolerated >> concerned he  will not be able to wean from vent soon Will need CPAP/BiPAP after extubation F/u CXR Continue scheduled BD's for now  CARDIOVASCULAR A:  Chronic diastolic dysfunction with hx of HTN. Hyperlipidemia. P:  F/u Echo Monitor hemodynamics Resume pravachol  Goal even to negative fluid balance ASA  RENAL A:   CKD. Hypokalemia. P:   Monitor urine outpt, renal fx, electrolytes Hold outpt diamox, lasix  GASTROINTESTINAL A:   Nutrition. Hx of GERD. P:   Tube feeds while on vent Protonix for SUP  HEMATOLOGIC A:   Anemia - chronic per report from daughter. P:  SCD's / Heparin. F/u CBC  INFECTIOUS A:   Tracheobronchitis 10/04. P:   Add rocephin, zithromax 10/04  Urine cx 10/02 >>  Sputum 10/04 >>  ENDOCRINE A:   Hx of DM with renal complications and neuropathy. Hx of gout. P:   SSI Hold outpt januvia Hold outpt allopurinol  NEUROLOGIC A:   Acute metabolic encephalopathy DM neuropathy. P:   RASS goal: 0 Hold outpt neurontin  CC time 35 minutes.  Coralyn HellingVineet Jamesyn Lindell, MD [facility] 03/13/2014, 10:33 AM Pager:  [phone] After 3pm call: [phone]

## 2014-03-14 ENCOUNTER — Encounter (HOSPITAL_COMMUNITY): Payer: Self-pay | Admitting: *Deleted

## 2014-03-14 ENCOUNTER — Inpatient Hospital Stay (HOSPITAL_COMMUNITY): Payer: Medicare Other

## 2014-03-14 LAB — CBC
HCT: 25.7 % — ABNORMAL LOW (ref 39.0–52.0)
HEMOGLOBIN: 7.7 g/dL — AB (ref 13.0–17.0)
MCH: 26.2 pg (ref 26.0–34.0)
MCHC: 30 g/dL (ref 30.0–36.0)
MCV: 87.4 fL (ref 78.0–100.0)
Platelets: 256 10*3/uL (ref 150–400)
RBC: 2.94 MIL/uL — ABNORMAL LOW (ref 4.22–5.81)
RDW: 19 % — ABNORMAL HIGH (ref 11.5–15.5)
WBC: 11.9 10*3/uL — AB (ref 4.0–10.5)

## 2014-03-14 LAB — BASIC METABOLIC PANEL
Anion gap: 12 (ref 5–15)
BUN: 43 mg/dL — AB (ref 6–23)
CHLORIDE: 99 meq/L (ref 96–112)
CO2: 28 meq/L (ref 19–32)
CREATININE: 2.18 mg/dL — AB (ref 0.50–1.35)
Calcium: 8 mg/dL — ABNORMAL LOW (ref 8.4–10.5)
GFR calc Af Amer: 32 mL/min — ABNORMAL LOW (ref >90)
GFR calc non Af Amer: 28 mL/min — ABNORMAL LOW (ref >90)
GLUCOSE: 190 mg/dL — AB (ref 70–99)
POTASSIUM: 3.1 meq/L — AB (ref 3.7–5.3)
Sodium: 139 mEq/L (ref 137–147)

## 2014-03-14 LAB — GLUCOSE, CAPILLARY
GLUCOSE-CAPILLARY: 159 mg/dL — AB (ref 70–99)
GLUCOSE-CAPILLARY: 155 mg/dL — AB (ref 70–99)
Glucose-Capillary: 175 mg/dL — ABNORMAL HIGH (ref 70–99)
Glucose-Capillary: 216 mg/dL — ABNORMAL HIGH (ref 70–99)
Glucose-Capillary: 135 mg/dL — ABNORMAL HIGH (ref 70–99)

## 2014-03-14 LAB — TRIGLYCERIDES: TRIGLYCERIDES: 87 mg/dL (ref <150)

## 2014-03-14 LAB — PHOSPHORUS: PHOSPHORUS: 4.1 mg/dL (ref 2.3–4.6)

## 2014-03-14 LAB — MAGNESIUM: Magnesium: 2.2 mg/dL (ref 1.5–2.5)

## 2014-03-14 MED ORDER — ALLOPURINOL 300 MG PO TABS
300.0000 mg | ORAL_TABLET | Freq: Every day | ORAL | Status: DC
  Administered 2014-03-14 – 2014-03-17 (×4): 300 mg
  Filled 2014-03-14 (×4): qty 1

## 2014-03-14 MED ORDER — POTASSIUM CHLORIDE 20 MEQ/15ML (10%) PO LIQD
40.0000 meq | Freq: Every day | ORAL | Status: DC
  Administered 2014-03-14 – 2014-03-16 (×3): 40 meq
  Filled 2014-03-14 (×4): qty 30

## 2014-03-14 NOTE — Progress Notes (Signed)
Increased PS to 15 d/t increased RR 37-38 on 10  PS.

## 2014-03-14 NOTE — Progress Notes (Signed)
PULMONARY / CRITICAL CARE MEDICINE   Name: Tamera StandsLeroy Mcewan MRN: [id number] DOB: [birth year]    ADMISSION DATE:  03/11/2014 CONSULTATION DATE:  03/14/2014  [facility] :  [facility]  CHIEF COMPLAINT:  SOB  INITIAL PRESENTATION: 78 y.o., [facility] where he presented for SOB.  He was placed on BiPAP but failed and subsequently required intubation.  [facility] and PCCM was consulted for admission.  STUDIES:   SIGNIFICANT EVENTS: [facility], [facility] for [facility] failure, [facility] 10/4 fever, increased respiratory secretions  SUBJECTIVE:  Up to chair, tolerating PSV 10 Awake and interacting on Propofol 15  VITAL SIGNS: Temp:  [98.7 F (37.1 C)-100.3 F (37.9 C)] 100.3 F (37.9 C) (10/05 0800) Pulse Rate:  [80-101] 96 (10/05 1125) Resp:  [13-31] 31 (10/05 1125) BP: (95-125)/(39-63) 125/52 mmHg (10/05 1125) SpO2:  [94 %-100 %] 97 % (10/05 1125) FiO2 (%):  [40 %] 40 % (10/05 1125) Weight:  [105 kg (231 lb 7.7 oz)] 105 kg (231 lb 7.7 oz) (10/05 0500) VENTILATOR SETTINGS: Vent Mode:  [-] PSV;CPAP FiO2 (%):  [40 %] 40 % Set Rate:  [12 bmp] 12 bmp Vt Set:  [550 mL] 550 mL PEEP:  [5 cmH20] 5 cmH20 Pressure Support:  [15 cmH20] 15 cmH20 Plateau Pressure:  [19 cmH20-23 cmH20] 20 cmH20 INTAKE / OUTPUT: Intake/Output     10/04 0701 - 10/05 0700 10/05 0701 - 10/06 0700   I.V. (mL/kg) 625.3 (6) 58.8 (0.6)   NG/GT 1145 255   IV Piggyback 400    Total Intake(mL/kg) 2170.3 (20.7) 313.8 (3)   Urine (mL/kg/hr) 1200 (0.5)    Stool  100 (0.2)   Total Output 1200 100   Net +970.3 +213.8          PHYSICAL EXAMINATION: General: no distress Neuro: RASS 0, moves all extremities HEENT: ETT in place Cardiovascular: regular   Lungs: decreased breath sounds, b/l rhonchi Abdomen: soft, non tender Musculoskeletal: ankle edema Skin:  no rashes  LABS:  CBC  Recent Labs Lab 03/12/14 0252 03/13/14 0230 03/14/14 0526  WBC 9.6  10.7* 11.9*  HGB 8.1* 8.0* 7.7*  HCT 28.1* 27.0* 25.7*  PLT 298 282 256   Coag's  Recent Labs Lab 03/11/14 0800  INR 1.26   BMET  Recent Labs Lab 03/12/14 0252 03/13/14 0230 03/14/14 0526  NA 140 141 139  K 3.0* 2.9* 3.1*  CL 98 99 99  CO2 32 32 28  BUN 26* 32* 43*  CREATININE 2.38* 2.26* 2.18*  GLUCOSE 156* 144* 190*   Electrolytes  Recent Labs Lab 03/12/14 0252 03/12/14 0742 03/13/14 0230 03/14/14 0526  CALCIUM 8.7  --  8.3* 8.0*  MG  --  1.5 1.9 2.2  PHOS  --  2.1* 3.6 4.1   ABG  Recent Labs Lab 03/11/14 0452 03/12/14 0315  PHART 7.515* 7.443  PCO2ART 43.1 48.3*  PO2ART 67.0* 80.9   Liver Enzymes  Recent Labs Lab 03/11/14 0800  AST 15  ALT 5  ALKPHOS 94  BILITOT 0.4  ALBUMIN 2.6*   Cardiac Enzymes  Recent Labs Lab 03/11/14 0800  PROBNP 2750.0*   Glucose  Recent Labs Lab 03/13/14 1116 03/13/14 1519 03/13/14 1916 03/13/14 2340 03/14/14 0316 03/14/14 0750  GLUCAP 148* 217* 153* 162* 159* 175*    Imaging Dg Chest Port 1 View  03/14/2014   CLINICAL DATA:  COPD.  EXAM: PORTABLE CHEST - 1 VIEW  COMPARISON:  03/12/2014.  FINDINGS:  Endotracheal tube terminates 3.4 cm above the carina. Nasogastric tube is followed into the stomach. Heart is enlarged, stable. Lungs are somewhat low in volume with mixed interstitial and airspace disease, basilar dependent. Findings are stable to minimally worsened from 03/12/2014. Right hemidiaphragm is elevated. No definite pleural fluid.  IMPRESSION: Mixed interstitial and airspace disease, basilar dependent, stable or slightly progressive from 03/12/2014 and likely due to pulmonary edema.   Electronically Signed   By: Leanna Battles M.D.   On: 03/14/2014 07:38    ASSESSMENT / PLAN:  PULMONARY OETT 10/2 >>> A: Acute on chronic hypercarbic and hypoxemic respiratory failure. Hx of COPD on home oxygen. Presumed OSA/OHS. P:   Pressure support wean as tolerated >> may end up being a slow wean Will  need CPAP/BiPAP after extubation F/u CXR Continue scheduled BD's for now  CARDIOVASCULAR A:  Chronic diastolic dysfunction with hx of HTN. Hyperlipidemia. P:  F/u Echo 10/6 Monitor hemodynamics Resume pravachol  Goal even to negative fluid balance ASA  RENAL A:   CKD. Hypokalemia. P:   Monitor urine outpt, renal fx, electrolytes Replace potassium Hold outpt diamox, lasix for now, consider restart 10/6  GASTROINTESTINAL A:   Nutrition. Hx of GERD. P:   Tube feeds while on vent Protonix for SUP  HEMATOLOGIC A:   Anemia - chronic per report from daughter. P:  SCD's / Heparin. F/u CBC  INFECTIOUS A:   Tracheobronchitis 10/04. P:   Started rocephin, zithromax 10/04, day 2 of 7  Urine cx 10/02 >>  Sputum 10/04 >>  ENDOCRINE A:   Hx of DM with renal complications and neuropathy. Hx of gout. P:   SSI Hold outpt januvia Hold outpt allopurinol  NEUROLOGIC A:   Acute metabolic encephalopathy, improving DM neuropathy. P:   RASS goal: 0 Hold outpt neurontin  CC time 35 minutes.  Levy Pupa, MD, PhD 03/14/2014, 12:49 PM [facility] [phone] or if no answer [phone]

## 2014-03-15 LAB — CULTURE, RESPIRATORY

## 2014-03-15 LAB — BASIC METABOLIC PANEL
ANION GAP: 12 (ref 5–15)
BUN: 61 mg/dL — ABNORMAL HIGH (ref 6–23)
CALCIUM: 8 mg/dL — AB (ref 8.4–10.5)
CO2: 30 meq/L (ref 19–32)
CREATININE: 2.38 mg/dL — AB (ref 0.50–1.35)
Chloride: 103 mEq/L (ref 96–112)
GFR calc Af Amer: 29 mL/min — ABNORMAL LOW (ref >90)
GFR calc non Af Amer: 25 mL/min — ABNORMAL LOW (ref >90)
GLUCOSE: 144 mg/dL — AB (ref 70–99)
Potassium: 3.4 mEq/L — ABNORMAL LOW (ref 3.7–5.3)
Sodium: 145 mEq/L (ref 137–147)

## 2014-03-15 LAB — GLUCOSE, CAPILLARY
GLUCOSE-CAPILLARY: 139 mg/dL — AB (ref 70–99)
GLUCOSE-CAPILLARY: 140 mg/dL — AB (ref 70–99)
GLUCOSE-CAPILLARY: 145 mg/dL — AB (ref 70–99)
Glucose-Capillary: 143 mg/dL — ABNORMAL HIGH (ref 70–99)
Glucose-Capillary: 122 mg/dL — ABNORMAL HIGH (ref 70–99)
Glucose-Capillary: 100 mg/dL — ABNORMAL HIGH (ref 70–99)

## 2014-03-15 LAB — CBC
HCT: 23.7 % — ABNORMAL LOW (ref 39.0–52.0)
HEMOGLOBIN: 7 g/dL — AB (ref 13.0–17.0)
MCH: 25.8 pg — AB (ref 26.0–34.0)
MCHC: 29.5 g/dL — ABNORMAL LOW (ref 30.0–36.0)
MCV: 87.5 fL (ref 78.0–100.0)
PLATELETS: 268 10*3/uL (ref 150–400)
RBC: 2.71 MIL/uL — AB (ref 4.22–5.81)
RDW: 19.2 % — ABNORMAL HIGH (ref 11.5–15.5)
WBC: 11.4 10*3/uL — ABNORMAL HIGH (ref 4.0–10.5)

## 2014-03-15 LAB — MAGNESIUM: Magnesium: 2.5 mg/dL (ref 1.5–2.5)

## 2014-03-15 LAB — PHOSPHORUS: Phosphorus: 4.8 mg/dL — ABNORMAL HIGH (ref 2.3–4.6)

## 2014-03-15 LAB — CULTURE, RESPIRATORY W GRAM STAIN

## 2014-03-15 NOTE — Progress Notes (Signed)
PULMONARY / CRITICAL CARE MEDICINE   Name: Tamera StandsLeroy Nickson MRN: [id number] DOB: Nov 12, 1935    ADMISSION DATE:  03/11/2014 CONSULTATION DATE:  03/15/2014  [facility] :  [facility]  CHIEF COMPLAINT:  SOB  INITIAL PRESENTATION: 78 y.o., [facility] he presented for [facility].  He was placed on BiPAP but failed and subsequently required intubation.  [facility] and PCCM was consulted for admission.  STUDIES:   SIGNIFICANT EVENTS: [facility], [facility]ubated for respiratory failure, [facility] fever, increased respiratory secretions  SUBJECTIVE:  Up to chair, tolerating PSV 10 Awake and interacting on Propofol 15  VITAL SIGNS: Temp:  [96.8 F (36 C)-100.5 F (38.1 C)] 97.7 F (36.5 C) (10/06 1000) Pulse Rate:  [69-103] 95 (10/06 1000) Resp:  [12-31] 19 (10/06 1000) BP: (106-189)/(37-81) 153/54 mmHg (10/06 1000) SpO2:  [94 %-100 %] 100 % (10/06 1000) FiO2 (%):  [40 %] 40 % (10/06 1000) Weight:  [105.8 kg (233 lb 4 oz)] 105.8 kg (233 lb 4 oz) (10/06 0400) VENTILATOR SETTINGS: Vent Mode:  [-] PSV;CPAP FiO2 (%):  [40 %] 40 % Set Rate:  [12 bmp] 12 bmp Vt Set:  [550 mL] 550 mL PEEP:  [5 cmH20] 5 cmH20 Pressure Support:  [5 cmH20-15 cmH20] 5 cmH20 Plateau Pressure:  [18 cmH20-20 cmH20] 18 cmH20 INTAKE / OUTPUT: Intake/Output     10/05 0701 - 10/06 0700 10/06 0701 - 10/07 0700   I.V. (mL/kg) 460.8 (4.4) 44.4 (0.4)   NG/GT 1330 108.8   IV Piggyback 300    Total Intake(mL/kg) 2090.8 (19.8) 153.2 (1.4)   Urine (mL/kg/hr) 1040 (0.4)    Stool 100 (0)    Total Output 1140     Net +950.8 +153.2          PHYSICAL EXAMINATION: General: no distress Neuro: RASS 0, moves all extremities HEENT: ETT in place Cardiovascular: regular   Lungs: decreased breath sounds, b/l rhonchi Abdomen: soft, non tender Musculoskeletal: ankle edema Skin:  no rashes  LABS:  CBC  Recent Labs Lab 03/13/14 0230 03/14/14 0526 03/15/14 0225   WBC 10.7* 11.9* 11.4*  HGB 8.0* 7.7* 7.0*  HCT 27.0* 25.7* 23.7*  PLT 282 256 268   Coag's  Recent Labs Lab 03/11/14 0800  INR 1.26   BMET  Recent Labs Lab 03/13/14 0230 03/14/14 0526 03/15/14 0225  NA 141 139 145  K 2.9* 3.1* 3.4*  CL 99 99 103  CO2 32 28 30  BUN 32* 43* 61*  CREATININE 2.26* 2.18* 2.38*  GLUCOSE 144* 190* 144*   Electrolytes  Recent Labs Lab 03/13/14 0230 03/14/14 0526 03/15/14 0225  CALCIUM 8.3* 8.0* 8.0*  MG 1.9 2.2 2.5  PHOS 3.6 4.1 4.8*   ABG  Recent Labs Lab 03/11/14 0452 03/12/14 0315  PHART 7.515* 7.443  PCO2ART 43.1 48.3*  PO2ART 67.0* 80.9   Liver Enzymes  Recent Labs Lab 03/11/14 0800  AST 15  ALT 5  ALKPHOS 94  BILITOT 0.4  ALBUMIN 2.6*   Cardiac Enzymes  Recent Labs Lab 03/11/14 0800  PROBNP 2750.0*   Glucose  Recent Labs Lab 03/14/14 1626 03/14/14 2046 03/15/14 0014 03/15/14 0405 03/15/14 0832 03/15/14 1144  GLUCAP 155* 135* 145* 140* 143* 139*    Imaging Dg Chest Port 1 View  03/14/2014   CLINICAL DATA:  COPD.  EXAM: PORTABLE CHEST - 1 VIEW  COMPARISON:  03/12/2014.  FINDINGS: Endotracheal tube terminates 3.4 cm above the carina. Nasogastric  tube is followed into the stomach. Heart is enlarged, stable. Lungs are somewhat low in volume with mixed interstitial and airspace disease, basilar dependent. Findings are stable to minimally worsened from 03/12/2014. Right hemidiaphragm is elevated. No definite pleural fluid.  IMPRESSION: Mixed interstitial and airspace disease, basilar dependent, stable or slightly progressive from 03/12/2014 and likely due to pulmonary edema.   Electronically Signed   By: Leanna Battles M.D.   On: 03/14/2014 07:38    ASSESSMENT / PLAN:  PULMONARY OETT 10/2 >>> A: Acute on chronic hypercarbic and hypoxemic respiratory failure. Hx of COPD on home oxygen. Presumed OSA/OHS. P:   Pressure support wean as tolerated >> may end up being a slow wean, hopefully extubation  10/6  Will need CPAP/BiPAP after extubation F/u CXR Continue scheduled BD's for now  CARDIOVASCULAR A:  Chronic diastolic dysfunction with hx of HTN. Hyperlipidemia. P:  F/u Echo 10/6, not yet done Monitor hemodynamics Resume pravachol  Goal even to negative fluid balance ASA  RENAL A:   CKD. Hypokalemia. P:   Monitor urine outpt, renal fx, electrolytes Replace potassium Hold outpt diamox, lasix for now, consider restart 10/7  GASTROINTESTINAL A:   Nutrition. Hx of GERD. P:   Tube feeds while on vent Protonix for SUP  HEMATOLOGIC A:   Anemia - chronic per report from daughter. P:  SCD's / Heparin. F/u CBC  INFECTIOUS A:   Tracheobronchitis 10/04. P:   Started rocephin, zithromax 10/04, day 3 of 7  Urine cx 10/02 >>  Sputum 10/04 >>  ENDOCRINE A:   Hx of DM with renal complications and neuropathy. Hx of gout. P:   SSI Hold outpt januvia Continue allopurinol  NEUROLOGIC A:   Acute metabolic encephalopathy, improving DM neuropathy. P:   RASS goal: 0 Hold outpt neurontin  CC time 35 minutes.  Levy Pupa, MD, PhD 03/15/2014, 12:11 PM [place] [facility] [phone] or if no answer [phone]

## 2014-03-15 NOTE — Procedures (Signed)
Extubation Procedure Note  Patient Details:   Name: Douglas Rogers DOB: 1936-03-12 MRN: [id number]   Airway Documentation:     Evaluation  O2 sats: stable throughout Complications: No apparent complications Patient did tolerate procedure well. Bilateral Breath Sounds: Diminished;Clear Suctioning: Airway;Oral Yes, pt able to speak, no stridor noted.  Pt placed on bipap immediately per MD plans for pt to use bipap post extubation, then prn today.  Pt does not want to wear bipap, but has agreed to wear bipap for 30 now.  RN and family at bedside and aware- agree with this plan.   Jennette KettleBrowning, Hargis Vandyne Joy 03/15/2014, 1:08 PM

## 2014-03-15 NOTE — Progress Notes (Signed)
Pt requested to have bipap taken off.  No distress noted, pt denies SOB.  Sat 100% on 4lpm [place].  BBSH clear diminished.

## 2014-03-16 ENCOUNTER — Inpatient Hospital Stay (HOSPITAL_COMMUNITY): Payer: Medicare Other

## 2014-03-16 LAB — BASIC METABOLIC PANEL
Anion gap: 14 (ref 5–15)
BUN: 59 mg/dL — ABNORMAL HIGH (ref 6–23)
CO2: 28 mEq/L (ref 19–32)
Calcium: 8.7 mg/dL (ref 8.4–10.5)
Chloride: 105 mEq/L (ref 96–112)
Creatinine, Ser: 1.96 mg/dL — ABNORMAL HIGH (ref 0.50–1.35)
GFR calc non Af Amer: 31 mL/min — ABNORMAL LOW (ref >90)
GFR, EST AFRICAN AMERICAN: 36 mL/min — AB (ref >90)
GLUCOSE: 121 mg/dL — AB (ref 70–99)
Potassium: 3.8 mEq/L (ref 3.7–5.3)
SODIUM: 147 meq/L (ref 137–147)

## 2014-03-16 LAB — CBC
HCT: 25.9 % — ABNORMAL LOW (ref 39.0–52.0)
HEMOGLOBIN: 7.6 g/dL — AB (ref 13.0–17.0)
MCH: 25.9 pg — ABNORMAL LOW (ref 26.0–34.0)
MCHC: 29.3 g/dL — ABNORMAL LOW (ref 30.0–36.0)
MCV: 88.4 fL (ref 78.0–100.0)
PLATELETS: 276 10*3/uL (ref 150–400)
RBC: 2.93 MIL/uL — ABNORMAL LOW (ref 4.22–5.81)
RDW: 19.3 % — AB (ref 11.5–15.5)
WBC: 10.2 10*3/uL (ref 4.0–10.5)

## 2014-03-16 LAB — GLUCOSE, CAPILLARY
GLUCOSE-CAPILLARY: 137 mg/dL — AB (ref 70–99)
GLUCOSE-CAPILLARY: 172 mg/dL — AB (ref 70–99)
Glucose-Capillary: 108 mg/dL — ABNORMAL HIGH (ref 70–99)
Glucose-Capillary: 109 mg/dL — ABNORMAL HIGH (ref 70–99)
Glucose-Capillary: 111 mg/dL — ABNORMAL HIGH (ref 70–99)
Glucose-Capillary: 111 mg/dL — ABNORMAL HIGH (ref 70–99)

## 2014-03-16 LAB — MAGNESIUM: Magnesium: 2.5 mg/dL (ref 1.5–2.5)

## 2014-03-16 LAB — PHOSPHORUS: PHOSPHORUS: 4 mg/dL (ref 2.3–4.6)

## 2014-03-16 MED ORDER — CHLORHEXIDINE GLUCONATE 0.12 % MT SOLN
15.0000 mL | Freq: Two times a day (BID) | OROMUCOSAL | Status: DC
Start: 2014-03-16 — End: 2014-03-17
  Administered 2014-03-16 – 2014-03-17 (×2): 15 mL via OROMUCOSAL
  Filled 2014-03-16 (×3): qty 15

## 2014-03-16 MED ORDER — CETYLPYRIDINIUM CHLORIDE 0.05 % MT LIQD
7.0000 mL | Freq: Two times a day (BID) | OROMUCOSAL | Status: DC
  Administered 2014-03-16 – 2014-03-17 (×4): 7 mL via OROMUCOSAL

## 2014-03-16 MED ORDER — POTASSIUM CHLORIDE CRYS ER 20 MEQ PO TBCR
40.0000 meq | EXTENDED_RELEASE_TABLET | Freq: Every day | ORAL | Status: DC
  Administered 2014-03-17 – 2014-03-19 (×3): 40 meq via ORAL
  Filled 2014-03-16 (×3): qty 2

## 2014-03-16 MED ORDER — PANTOPRAZOLE SODIUM 40 MG PO TBEC
40.0000 mg | DELAYED_RELEASE_TABLET | Freq: Every day | ORAL | Status: DC
  Administered 2014-03-16 – 2014-03-18 (×3): 40 mg via ORAL
  Filled 2014-03-16 (×3): qty 1

## 2014-03-16 NOTE — Progress Notes (Signed)
Pt asked to have bipap taken off for a short period of time; placed on 5 L [place]; O2 sat 100%; no distress noted... Nydia BoutonJames, Elen Acero Sara,RN

## 2014-03-16 NOTE — Progress Notes (Signed)
Rehab Admissions Coordinator Note:  Patient was screened by Clois DupesBoyette, Keandria Berrocal Godwin for appropriateness for an Inpatient Acute Rehab Consult per PT recommendation. At this time, we are recommending Inpatient Rehab consult. Please place order.  Clois DupesBoyette, Newman Waren Godwin 03/16/2014, 11:20 AM  I can be reached at [phone].

## 2014-03-16 NOTE — Procedures (Signed)
Objective Swallowing Evaluation: Modified Barium Swallowing Study  Patient Details  Name: Douglas Rogers MRN: [id number] Date of Birth: Jun 03, 1936  Today's Date: 03/16/2014 Time: [id number] SLP Time Calculation (min): 30 min  Past Medical History:  Past Medical History  Diagnosis Date  . COPD (chronic obstructive pulmonary disease)   . Chronic respiratory failure   . CHF (congestive heart failure)   . Diabetes mellitus   . Anemia   . Colon polyp    Past Surgical History:  Past Surgical History  Procedure Laterality Date  . Colonoscopy     HPI:  78 year old male admitted 03/11/14 due to SOB, increasing weakness. PMH significant for COPD. Pt required intubation for 4 days, and required BiPAP last night after extubation. BSE revealed s/s suspicious for airway compromise.     Assessment / Plan / Recommendation Clinical Impression  Dysphagia Diagnosis: Mild pharyngeal phase dysphagia Clinical impression: Mild sensory based pharyngeal dysphagia, characterized by delayed swallow reflex on thin liquids. Intermittent trace flash penetration seen on thin liquid via cup sip, prevented with small individual sips. Penetration with straw use was deeper and more in amount, so straw use is discouraged. Puree and solid consistencies tolerate well without airway compromise or post-swallow residue. Pt exhibited throat clearing and weak cough throughout MBS, however, this was noted prior to po presentation, and in the absense of penetration or aspiration. Will begin dys 3 diet with thin liquids via cup sip, and follow to assess diet tolerance.    Treatment Recommendation  Therapy as outlined in treatment plan below    Diet Recommendation Dysphagia 3 (Mechanical Soft);Thin liquid   Liquid Administration via: No straw;Cup Medication Administration: Whole meds with liquid Supervision: Patient able to self feed;Intermittent supervision to cue for compensatory strategies Compensations: Slow rate;Small  sips/bites Postural Changes and/or Swallow Maneuvers: Seated upright 90 degrees    Other  Recommendations Recommended Consults: MBS Oral Care Recommendations: Oral care BID   Follow Up Recommendations  None    Frequency and Duration min 1 x/week  1 week   Pertinent Vitals/Pain VSS, no pain reported    SLP Swallow Goals  die tolerance   General Date of Onset: 03/11/14 HPI: 78 year old male admitted 03/11/14 due to SOB, increasing weakness. PMH significant for COPD. Pt required intubation for 4 days, and required BiPAP last night after extubation. BSE revealed s/s suspicious for airway compromise. Type of Study: Modified Barium Swallowing Study Reason for Referral: Objectively evaluate swallowing function Previous Swallow Assessment: BSE this morning. Pt exhibited throat clearing and weak cough throughout bedside assessment. Given recent intubation and history of COPD, MBS was recommended. Diet Prior to this Study: NPO Temperature Spikes Noted: No Respiratory Status: Nasal cannula History of Recent Intubation: Yes Length of Intubations (days): 4 days Date extubated: 03/15/14 Behavior/Cognition: Alert;Cooperative;Pleasant mood Oral Cavity - Dentition: Dentures, top;Dentures, bottom Oral Motor / Sensory Function: Within functional limits Self-Feeding Abilities: Able to feed self Patient Positioning: Upright in chair Baseline Vocal Quality: Clear Volitional Cough: Weak Volitional Swallow: Able to elicit Anatomy: Within functional limits Pharyngeal Secretions: Not observed secondary MBS    Reason for Referral Objectively evaluate swallowing function   Oral Phase Oral Preparation/Oral Phase Oral Phase: WFL   Pharyngeal Phase Pharyngeal Phase Pharyngeal Phase: Impaired Pharyngeal - Thin Pharyngeal - Thin Cup: Delayed swallow initiation;Premature spillage to valleculae;Penetration/Aspiration during swallow Penetration/Aspiration details (thin cup): Material does not enter  airway;Material enters airway, remains ABOVE vocal cords then ejected out Pharyngeal - Thin Straw: Delayed swallow initiation;Premature spillage to  valleculae;Premature spillage to pyriform sinuses;Penetration/Aspiration during swallow Penetration/Aspiration details (thin straw): Material enters airway, remains ABOVE vocal cords then ejected out Pharyngeal - Solids Pharyngeal - Puree: Within functional limits Pharyngeal - Multi-consistency: Delayed swallow initiation;Premature spillage to valleculae Pharyngeal - Pill: Within functional limits  Cervical Esophageal Phase    GO    Cervical Esophageal Phase Cervical Esophageal Phase: [facility]        Douglas Rogers [facility], CCC-SLP [phone]  Leigh Aurora 03/16/2014, 1:00 PM

## 2014-03-16 NOTE — Evaluation (Signed)
Physical Therapy Evaluation Patient Details Name: Douglas Rogers MRN: [id number] DOB: [birth year] Today's Date: 03/16/2014   History of Present Illness    78 year old male admitted 03/11/14 due to SOB, increasing weakness. PMH significant for COPD. Pt required intubation for 4 days, and required BiPAP last night after extubation.      Clinical Impression  Pt admitted with above. Pt currently with functional limitations due to the deficits listed below (see PT Problem List).  Pt will benefit from skilled PT to increase their independence and safety with mobility to allow discharge to the venue listed below.     Follow Up Recommendations CIR;Supervision/Assistance - 24 hour    Equipment Recommendations  Other (comment) (TBA)    Recommendations for Other Services Rehab consult     Precautions / Restrictions Precautions Precautions: Fall Restrictions Weight Bearing Restrictions: No      Mobility  Bed Mobility Overal bed mobility: Needs Assistance Bed Mobility: Supine to Sit     Supine to sit: Mod assist     General bed mobility comments: Needed assist for LEs and elevation of trunk.   Transfers Overall transfer level: Needs assistance Equipment used: None Transfers: Sit to/from [employer] Sit to Stand: Mod assist;From elevated surface Stand pivot transfers: Min assist       General transfer comment: Pt needed mod assist and use of momentum to stand taking incr time to stand semi upright.  Kept bil LEs flexed but was able to take pivotal steps to chair with assist for weight shift and incr time.    Ambulation/Gait                Stairs            Wheelchair Mobility    Modified Rankin (Stroke Patients Only)       Balance Overall balance assessment: Needs assistance;History of Falls Sitting-balance support: Feet supported;Single extremity supported Sitting balance-Leahy Scale: Poor Sitting balance - Comments: Needed support at EOB  initially.   Postural control: Posterior lean                                   Pertinent Vitals/Pain Pain Assessment: 0-10 Pain Score: 5  Pain Location: right elbow Pain Descriptors / Indicators: Constant;Aching Pain Intervention(s): Limited activity within patient's tolerance;Monitored during session;Repositioned 122/49 BP, 95 bpm HR, 98% O2 sat on 3LO2    Home Living Family/patient expects to be discharged to:: Private residence Living Arrangements: Spouse/significant other;Children;Other relatives (niece is CNA) Available Help at Discharge: Family;Available 24 hours/day Type of Home: House Home Access: Stairs to enter Entrance Stairs-Rails: Right;Left;Can reach both Entrance Stairs-Number of Steps: 5 Home Layout: One level Home Equipment: Cane - single point (3LO2 home O2)      Prior Function Level of Independence: Independent with assistive device(s)               Hand Dominance        Extremity/Trunk Assessment   Upper Extremity Assessment: Defer to OT evaluation           Lower Extremity Assessment: Generalized weakness         Communication   Communication: No difficulties  Cognition Arousal/Alertness: Awake/alert Behavior During Therapy: WFL for tasks assessed/performed Overall Cognitive Status: Within Functional Limits for tasks assessed                      General Comments General  comments (skin integrity, edema, etc.): gout in right elbow per pt.  Not using right UE.      Exercises General Exercises - Lower Extremity Ankle Circles/Pumps: AROM;Both;5 reps;Supine Long Arc Quad: AROM;Both;10 reps;Seated      Assessment/Plan    PT Assessment Patient needs continued PT services  PT Diagnosis Generalized weakness;Acute pain   PT Problem List Decreased strength;Decreased activity tolerance;Decreased balance;Decreased mobility;Decreased knowledge of use of DME;Decreased safety awareness;Decreased knowledge of  precautions;Pain  PT Treatment Interventions DME instruction;Gait training;Functional mobility training;Therapeutic activities;Therapeutic exercise;Balance training;Patient/family education   PT Goals (Current goals can be found in the Care Plan section) Acute Rehab PT Goals Patient Stated Goal: to go home PT Goal Formulation: With patient Time For Goal Achievement: 03/30/14 Potential to Achieve Goals: Good    Frequency Min 3X/week   Barriers to discharge        Co-evaluation               End of Session Equipment Utilized During Treatment: Gait belt;Oxygen Activity Tolerance: Patient limited by fatigue;Patient limited by pain Patient left: in chair;with call bell/phone within reach Nurse Communication: Mobility status;Need for lift equipment         Time: 0942-1000 PT Time Calculation (min): 18 min   Charges:   PT Evaluation $Initial PT Evaluation Tier I: 1 Procedure PT Treatments $Therapeutic Activity: 8-22 mins   PT G CodesBerline Lopes:          Gaylon Melchor F 03/16/2014, 10:49 AM Eber Jonesawn Jayvon Mounger,PT Acute Rehabilitation [phone] (pager)

## 2014-03-16 NOTE — Progress Notes (Addendum)
Pt had order to transfer to tele bed. Pt [facility] tonight. [facility] Bipap pts who do not normally use Bipap at home on that floor. Pt currently has an order to wear BIPAP qhs. Pola CornELINK MD called and notified. Order received to leave pt in ICU tonight. Family called and updated.

## 2014-03-16 NOTE — Progress Notes (Signed)
PULMONARY / CRITICAL CARE MEDICINE   Name: Douglas Rogers MRN: [id number] DOB: May 16, 1936    ADMISSION DATE:  03/11/2014 CONSULTATION DATE:  03/16/2014  REFERRING MD :  [facility]  CHIEF COMPLAINT:  SOB  INITIAL PRESENTATION: 78 y.o., taken to [facility] ED where he presented for SOB.  He was placed on BiPAP but failed and subsequently required intubation.  He was transferred to [facility] ICU and PCCM was consulted for admission.  STUDIES:   SIGNIFICANT EVENTS: 10/2 presented to [facility] ED, intubated for respiratory failure, transferred to [facility] 10/4 fever, increased respiratory secretions  SUBJECTIVE:  Tolerated extubation Wore biPAP last night as planned  VITAL SIGNS: Temp:  [97.8 F (36.6 C)-98.7 F (37.1 C)] 98.5 F (36.9 C) (10/07 1200) Pulse Rate:  [84-103] 103 (10/07 1200) Resp:  [13-35] 27 (10/07 1200) BP: (106-164)/(43-77) 164/71 mmHg (10/07 1200) SpO2:  [96 %-100 %] 97 % (10/07 1323) FiO2 (%):  [40 %] 40 % (10/07 0224) Weight:  [107.1 kg (236 lb 1.8 oz)] 107.1 kg (236 lb 1.8 oz) (10/07 0146) VENTILATOR SETTINGS: Vent Mode:  [-] BIPAP FiO2 (%):  [40 %] 40 % Set Rate:  [12 bmp] 12 bmp INTAKE / OUTPUT: Intake/Output     10/06 0701 - 10/07 0700 10/07 0701 - 10/08 0700   I.V. (mL/kg) 254.4 (2.4) 40 (0.4)   NG/GT 108.8    IV Piggyback 300    Total Intake(mL/kg) 663.2 (6.2) 40 (0.4)   Urine (mL/kg/hr) 1025 (0.4) 350 (0.4)   Stool     Total Output 1025 350   Net -361.9 -310          PHYSICAL EXAMINATION: General: no distress Neuro: RASS 0, moves all extremities HEENT: Op clear, no lesions Cardiovascular: regular   Lungs: decreased breath sounds, b/l rhonchi Abdomen: soft, non tender Musculoskeletal: ankle edema Skin:  no rashes  LABS:  CBC  Recent Labs Lab 03/14/14 0526 03/15/14 0225 03/16/14 0235  WBC 11.9* 11.4* 10.2  HGB 7.7* 7.0* 7.6*  HCT 25.7* 23.7* 25.9*  PLT 256 268 276   Coag's  Recent Labs Lab 03/11/14 0800  INR 1.26   BMET  Recent  Labs Lab 03/14/14 0526 03/15/14 0225 03/16/14 0235  NA 139 145 147  K 3.1* 3.4* 3.8  CL 99 103 105  CO2 28 30 28   BUN 43* 61* 59*  CREATININE 2.18* 2.38* 1.96*  GLUCOSE 190* 144* 121*   Electrolytes  Recent Labs Lab 03/14/14 0526 03/15/14 0225 03/16/14 0235  CALCIUM 8.0* 8.0* 8.7  MG 2.2 2.5 2.5  PHOS 4.1 4.8* 4.0   ABG  Recent Labs Lab 03/11/14 0452 03/12/14 0315  PHART 7.515* 7.443  PCO2ART 43.1 48.3*  PO2ART 67.0* 80.9   Liver Enzymes  Recent Labs Lab 03/11/14 0800  AST 15  ALT 5  ALKPHOS 94  BILITOT 0.4  ALBUMIN 2.6*   Cardiac Enzymes  Recent Labs Lab 03/11/14 0800  PROBNP 2750.0*   Glucose  Recent Labs Lab 03/15/14 1627 03/15/14 2035 03/16/14 0020 03/16/14 0353 03/16/14 0834 03/16/14 1255  GLUCAP 122* 100* 109* 108* 111* 137*    Imaging Dg Chest Port 1 View  03/16/2014   CLINICAL DATA:  Respiratory failure.  Shortness of breath.  EXAM: PORTABLE CHEST - 1 VIEW  COMPARISON:  03/14/2014.  FINDINGS: There has been interval removal of the enteric tube and nasogastric tube. The lung volumes are low. There are persistent small pleural effusions and mild interstitial edema.  IMPRESSION: 1. No change in CHF pattern.  Electronically Signed   By: Signa Kell M.D.   On: 03/16/2014 07:54   Dg Swallowing Func-speech Pathology  03/16/2014   Gray Bernhardt, CCC-SLP     03/16/2014  1:01 PM Objective Swallowing Evaluation: Modified Barium Swallowing Study   Patient Details  Name: Douglas Rogers MRN: [id number] Date of Birth: [birth year]  Today's Date: 03/16/2014 Time: 4696-2952 SLP Time Calculation (min): 30 min  Past Medical History:  Past Medical History  Diagnosis Date  . COPD (chronic obstructive pulmonary disease)   . Chronic respiratory failure   . CHF (congestive heart failure)   . Diabetes mellitus   . Anemia   . Colon polyp    Past Surgical History:  Past Surgical History  Procedure Laterality Date  . Colonoscopy     HPI:  78 year old male admitted  03/11/14 due to SOB, increasing  weakness. PMH significant for COPD. Pt required intubation for 4  days, and required BiPAP last night after extubation. BSE  revealed s/s suspicious for airway compromise.     Assessment / Plan / Recommendation Clinical Impression  Dysphagia Diagnosis: Mild pharyngeal phase dysphagia Clinical impression: Mild sensory based pharyngeal dysphagia,  characterized by delayed swallow reflex on thin liquids.  Intermittent trace flash penetration seen on thin liquid via cup  sip, prevented with small individual sips. Penetration with straw  use was deeper and more in amount, so straw use is discouraged.  Puree and solid consistencies tolerate well without airway  compromise or post-swallow residue. Pt exhibited throat clearing  and weak cough throughout MBS, however, this was noted prior to  po presentation, and in the absense of penetration or aspiration.  Will begin dys 3 diet with thin liquids via cup sip, and follow  to assess diet tolerance.    Treatment Recommendation  Therapy as outlined in treatment plan below    Diet Recommendation Dysphagia 3 (Mechanical Soft);Thin liquid   Liquid Administration via: No straw;Cup Medication Administration: Whole meds with liquid Supervision: Patient able to self feed;Intermittent supervision  to cue for compensatory strategies Compensations: Slow rate;Small sips/bites Postural Changes and/or Swallow Maneuvers: Seated upright 90  degrees    Other  Recommendations Recommended Consults: MBS Oral Care Recommendations: Oral care BID   Follow Up Recommendations  None    Frequency and Duration min 1 x/week  1 week   Pertinent Vitals/Pain VSS, no pain reported    SLP Swallow Goals  die tolerance   General Date of Onset: 03/11/14 HPI: 77 year old male admitted 03/11/14 due to SOB, increasing  weakness. PMH significant for COPD. Pt required intubation for 4  days, and required BiPAP last night after extubation. BSE  revealed s/s suspicious for airway  compromise. Type of Study: Modified Barium Swallowing Study Reason for Referral: Objectively evaluate swallowing function Previous Swallow Assessment: BSE this morning. Pt exhibited  throat clearing and weak cough throughout bedside assessment.  Given recent intubation and history of COPD, MBS was recommended. Diet Prior to this Study: NPO Temperature Spikes Noted: No Respiratory Status: Nasal cannula History of Recent Intubation: Yes Length of Intubations (days): 4 days Date extubated: 03/15/14 Behavior/Cognition: Alert;Cooperative;Pleasant mood Oral Cavity - Dentition: Dentures, top;Dentures, bottom Oral Motor / Sensory Function: Within functional limits Self-Feeding Abilities: Able to feed self Patient Positioning: Upright in chair Baseline Vocal Quality: Clear Volitional Cough: Weak Volitional Swallow: Able to elicit Anatomy: Within functional limits Pharyngeal Secretions: Not observed secondary MBS    Reason for Referral Objectively evaluate swallowing function   Oral Phase  Oral Preparation/Oral Phase Oral Phase: WFL   Pharyngeal Phase Pharyngeal Phase Pharyngeal Phase: Impaired Pharyngeal - Thin Pharyngeal - Thin Cup: Delayed swallow initiation;Premature  spillage to valleculae;Penetration/Aspiration during swallow Penetration/Aspiration details (thin cup): Material does not  enter airway;Material enters airway, remains ABOVE vocal cords  then ejected out Pharyngeal - Thin Straw: Delayed swallow initiation;Premature  spillage to valleculae;Premature spillage to pyriform  sinuses;Penetration/Aspiration during swallow Penetration/Aspiration details (thin straw): Material enters  airway, remains ABOVE vocal cords then ejected out Pharyngeal - Solids Pharyngeal - Puree: Within functional limits Pharyngeal - Multi-consistency: Delayed swallow  initiation;Premature spillage to valleculae Pharyngeal - Pill: Within functional limits  Cervical Esophageal Phase    GO    Cervical Esophageal Phase Cervical Esophageal  Phase: [facility] CampusWFL        Celia B. Murvin NatalBueche, Un[facility] Hospitals At WakebrookMSP, CCC-SLP [phone]  Leigh AuroraBueche, Celia Brown 03/16/2014, 1:00 PM     ASSESSMENT / PLAN:  PULMONARY OETT 10/2 >>> A: Acute on chronic hypercarbic and hypoxemic respiratory failure. Hx of COPD on home oxygen. Presumed OSA/OHS. P:   Mandatory  BiPAP qhs F/u CXR Continue scheduled BD's for now  CARDIOVASCULAR A:  Chronic diastolic dysfunction with hx of HTN. Hyperlipidemia. P:  F/u Echo, not yet done Monitor hemodynamics Continue pravachol  Goal even to negative fluid balance ASA  RENAL A:   CKD. Hypokalemia. P:   Monitor urine outpt, renal fx, electrolytes Replace potassium Hold outpt diamox, lasix for now, plan restart 10/8  GASTROINTESTINAL A:   Nutrition. Hx of GERD. P:   Modified barium swallow 10/7 to assess ability to take diet Protonix for SUP  HEMATOLOGIC A:   Anemia - chronic per report from daughter. P:  SCD's / Heparin. F/u CBC  INFECTIOUS A:   Tracheobronchitis 10/04. P:   Started rocephin, zithromax 10/04, day 4 of 7 Probably transition to PO abx on 10/8  Urine cx 10/02 >> negative Sputum 10/04 >> normal flora  ENDOCRINE A:   Hx of DM with renal complications and neuropathy. Hx of gout. P:   SSI Hold outpt januvia until taking good PO Continue allopurinol  NEUROLOGIC A:   Acute metabolic encephalopathy, improving DM neuropathy. P:   RASS goal: 0 Hold outpt neurontin until taking good PO  Transfer to Telemetry bed 10/7  Levy Pupaobert Lorilee Cafarella, MD, PhD 03/16/2014, 2:23 PM [facility] [phone] or if no answer [phone]

## 2014-03-16 NOTE — Progress Notes (Signed)
HR up to 150; o2 sat stable in the 90s; placed pt back on bipap; HR back down to 80s/SR... Marvia PicklesJames, Tyress Loden Sara, RN

## 2014-03-16 NOTE — Evaluation (Signed)
Clinical/Bedside Swallow Evaluation Patient Details  Name: Douglas Rogers MRN: [id number] Date of Birth: 1935/06/24  Today's Date: 03/16/2014 Time: 0900-0935 SLP Time Calculation (min): 35 min  Past Medical History:  Past Medical History  Diagnosis Date  . COPD (chronic obstructive pulmonary disease)   . Chronic respiratory failure   . CHF (congestive heart failure)   . Diabetes mellitus   . Anemia   . Colon polyp    Past Surgical History:  Past Surgical History  Procedure Laterality Date  . Colonoscopy     HPI:  78 year old male admitted 03/11/14 due to SOB, increasing weakness. PMH significant for COPD. Pt required intubation for 4 days, and required BiPAP last night after extubation.   Assessment / Plan / Recommendation Clinical Impression  Pt removed upper and lower dentures independently. Pt's oral motor strength and function appear adequate. No cough noted prior to po trials, however, during and after po trials pt was noted to exhibit weak throat clearing and cough.  Pt is at risk for silent aspiration (COPD), and recent intubation (4 days) increases likelihood of dysphagia, so will continue NPO status and complete MBS to objectively assess swallow function and identify least restrictive diet. RN aware.    Aspiration Risk  Moderate    Diet Recommendation NPO        Other  Recommendations Recommended Consults: MBS   Follow Up Recommendations    pending MBS   Frequency and Duration   pending MBS      Pertinent Vitals/Pain VSS, no pain reported    SLP Swallow Goals  pending MBS   Swallow Study Prior Functional Status   Pt reports no history of swallowing deficits prior to admit. Pt has history of COPD, which increases risk of silent aspiration, which cannot be detected at bedside.    General Date of Onset: 03/11/14 HPI: 78 year old male admitted 03/11/14 due to SOB, increasing weakness. PMH significant for COPD. Pt required intubation for 4 days, and required  BiPAP last night after extubation. Type of Study: Bedside swallow evaluation Previous Swallow Assessment: none Diet Prior to this Study: NPO Temperature Spikes Noted: No Respiratory Status: Nasal cannula History of Recent Intubation: Yes Length of Intubations (days): 4 days Date extubated: 03/15/14 Behavior/Cognition: Alert;Cooperative Oral Cavity - Dentition: Dentures, top;Dentures, bottom Self-Feeding Abilities: Needs assist Patient Positioning: Upright in bed Baseline Vocal Quality: Clear Volitional Cough: Weak Volitional Swallow: Able to elicit    Oral/Motor/Sensory Function Overall Oral Motor/Sensory Function: Appears within functional limits for tasks assessed   Ice Chips Ice chips: Within functional limits Presentation: Spoon   Thin Liquid Thin Liquid: Impaired Presentation: Straw;Cup Pharyngeal  Phase Impairments: Cough - Delayed;Throat Clearing - Delayed;Suspected delayed Swallow    Nectar Thick Nectar Thick Liquid: Not tested   Honey Thick Honey Thick Liquid: Not tested   Puree Puree: Impaired Presentation: Spoon Pharyngeal Phase Impairments: Cough - Delayed;Throat Clearing - Delayed;Suspected delayed Swallow   Solid   GO   Douglas Rogers, Douglas Rogers, Douglas Rogers Solid: Impaired Pharyngeal Phase Impairments: Cough - Delayed;Throat Clearing - Delayed       Leigh AuroraBueche, Jianni Shelden Brown 03/16/2014,9:47 AM

## 2014-03-17 DIAGNOSIS — R5381 Other malaise: Secondary | ICD-10-CM

## 2014-03-17 LAB — GLUCOSE, CAPILLARY
GLUCOSE-CAPILLARY: 146 mg/dL — AB (ref 70–99)
Glucose-Capillary: 133 mg/dL — ABNORMAL HIGH (ref 70–99)
Glucose-Capillary: 138 mg/dL — ABNORMAL HIGH (ref 70–99)
Glucose-Capillary: 87 mg/dL (ref 70–99)
Glucose-Capillary: 94 mg/dL (ref 70–99)
Glucose-Capillary: 98 mg/dL (ref 70–99)

## 2014-03-17 MED ORDER — AZITHROMYCIN 250 MG PO TABS
250.0000 mg | ORAL_TABLET | Freq: Every day | ORAL | Status: AC
  Administered 2014-03-18 – 2014-03-19 (×2): 250 mg via ORAL
  Filled 2014-03-17 (×2): qty 1

## 2014-03-17 MED ORDER — FUROSEMIDE 10 MG/ML IJ SOLN
INTRAMUSCULAR | Status: AC
  Filled 2014-03-17: qty 4

## 2014-03-17 MED ORDER — INSULIN ASPART 100 UNIT/ML ~~LOC~~ SOLN
0.0000 [IU] | Freq: Three times a day (TID) | SUBCUTANEOUS | Status: DC
  Administered 2014-03-17 – 2014-03-18 (×2): 2 [IU] via SUBCUTANEOUS

## 2014-03-17 MED ORDER — CEPHALEXIN 500 MG PO CAPS
500.0000 mg | ORAL_CAPSULE | Freq: Two times a day (BID) | ORAL | Status: DC
  Administered 2014-03-18: 500 mg via ORAL
  Filled 2014-03-17 (×2): qty 1

## 2014-03-17 MED ORDER — FUROSEMIDE 40 MG PO TABS
40.0000 mg | ORAL_TABLET | Freq: Every day | ORAL | Status: DC
  Administered 2014-03-17 – 2014-03-19 (×3): 40 mg via ORAL
  Filled 2014-03-17 (×3): qty 1

## 2014-03-17 MED ORDER — CEPHALEXIN 500 MG PO CAPS: 500.0000 mg | ORAL_CAPSULE | Freq: Two times a day (BID) | ORAL | Status: DC

## 2014-03-17 MED ORDER — PREDNISONE 20 MG PO TABS
20.0000 mg | ORAL_TABLET | Freq: Every day | ORAL | Status: DC
  Administered 2014-03-17 – 2014-03-19 (×3): 20 mg via ORAL
  Filled 2014-03-17 (×5): qty 1

## 2014-03-17 MED ORDER — INSULIN ASPART 100 UNIT/ML ~~LOC~~ SOLN: 0.0000 [IU] | Freq: Every day | SUBCUTANEOUS | Status: DC

## 2014-03-17 NOTE — Progress Notes (Addendum)
PULMONARY / CRITICAL CARE MEDICINE   Name: Douglas Rogers MRN: [id number] DOB: 1935-10-05    ADMISSION DATE:  03/11/2014 CONSULTATION DATE:  03/17/2014  REFERRING MD :  Douglas Rogers ED  CHIEF COMPLAINT:  SOB  INITIAL PRESENTATION: 78 y.o., taken to [facility] ED where he presented for SOB.  He was placed on BiPAP but failed and subsequently required intubation.  He was transferred to [facility] ICU and PCCM was consulted for admission.  STUDIES:   SIGNIFICANT EVENTS: 10/2 presented to [facility] ED, intubated for respiratory failure, transferred to [facility] 10/4 fever, increased respiratory secretions  SUBJECTIVE: attempted transfer yesterday, as pt stable and floor appropriate, however there was confusion about his night time biPAP use   VITAL SIGNS: Temp:  [98.1 F (36.7 C)-99.4 F (37.4 C)] 99.4 F (37.4 C) (10/08 1400) Pulse Rate:  [80-105] 103 (10/08 1400) Resp:  [14-34] 22 (10/08 1400) BP: (116-175)/(44-93) 149/56 mmHg (10/08 0900) SpO2:  [94 %-100 %] 100 % (10/08 1400) FiO2 (%):  [3 %-40 %] 3 % (10/08 0900) Weight:  [231 lb 11.3 oz (105.1 kg)] 231 lb 11.3 oz (105.1 kg) (10/08 0500) VENTILATOR SETTINGS: Vent Mode:  [-] BIPAP FiO2 (%):  [3 %-40 %] 3 % Set Rate:  [12 bmp] 12 bmp PEEP:  [8 cmH20] 8 cmH20 INTAKE / OUTPUT: Intake/Output     10/07 0701 - 10/08 0700 10/08 0701 - 10/09 0700   P.O. 300    I.V. (mL/kg) 240 (2.3) 30 (0.3)   NG/GT     IV Piggyback 300    Total Intake(mL/kg) 840 (8) 30 (0.3)   Urine (mL/kg/hr) 1750 (0.7) 160 (0.2)   Total Output 1750 160   Net -910 -130          PHYSICAL EXAMINATION: General: no distress Neuro: awake and alert HEENT: Op clear, no lesions Cardiovascular: regular   Lungs: decreased breath sounds, b/l rhonchi, coughing Abdomen: soft, non tender Musculoskeletal: ankle edema Skin:  no rashes  LABS:  CBC  Recent Labs Lab 03/14/14 0526 03/15/14 0225 03/16/14 0235  WBC 11.9* 11.4* 10.2  HGB 7.7* 7.0* 7.6*  HCT 25.7* 23.7* 25.9*  PLT  256 268 276   Coag's  Recent Labs Lab 03/11/14 0800  INR 1.26   BMET  Recent Labs Lab 03/14/14 0526 03/15/14 0225 03/16/14 0235  NA 139 145 147  K 3.1* 3.4* 3.8  CL 99 103 105  CO2 28 30 28   BUN 43* 61* 59*  CREATININE 2.18* 2.38* 1.96*  GLUCOSE 190* 144* 121*   Electrolytes  Recent Labs Lab 03/14/14 0526 03/15/14 0225 03/16/14 0235  CALCIUM 8.0* 8.0* 8.7  MG 2.2 2.5 2.5  PHOS 4.1 4.8* 4.0   ABG  Recent Labs Lab 03/11/14 0452 03/12/14 0315  PHART 7.515* 7.443  PCO2ART 43.1 48.3*  PO2ART 67.0* 80.9   Liver Enzymes  Recent Labs Lab 03/11/14 0800  AST 15  ALT 5  ALKPHOS 94  BILITOT 0.4  ALBUMIN 2.6*   Cardiac Enzymes  Recent Labs Lab 03/11/14 0800  PROBNP 2750.0*   Glucose  Recent Labs Lab 03/16/14 1543 03/16/14 1919 03/17/14 0027 03/17/14 0359 03/17/14 0804 03/17/14 1217  GLUCAP 172* 111* 94 87 98 146*    Imaging Dg Chest Port 1 View  03/16/2014   CLINICAL DATA:  Respiratory failure.  Shortness of breath.  EXAM: PORTABLE CHEST - 1 VIEW  COMPARISON:  03/14/2014.  FINDINGS: There has been interval removal of the enteric tube and nasogastric tube. The lung volumes are low. There are  persistent small pleural effusions and mild interstitial edema.  IMPRESSION: 1. No change in CHF pattern.   Electronically Signed   By: Douglas Kellaylor  Rogers M.D.   On: 03/16/2014 07:54   Dg Swallowing Func-speech Pathology  03/16/2014   Douglas Rogers, CCC-SLP     03/16/2014  1:01 PM Objective Swallowing Evaluation: Modified Barium Swallowing Study   Patient Details  Name: Douglas Rogers MRN: [id number] Date of Birth: 03/13/1936  Today's Date: 03/16/2014 Time: [id number] SLP Time Calculation (min): 30 min  Past Medical History:  Past Medical History  Diagnosis Date  . COPD (chronic obstructive pulmonary disease)   . Chronic respiratory failure   . CHF (congestive heart failure)   . Diabetes mellitus   . Anemia   . Colon polyp    Past Surgical History:  Past Surgical History   Procedure Laterality Date  . Colonoscopy     HPI:  78 year old male admitted 03/11/14 due to SOB, increasing  weakness. PMH significant for COPD. Pt required intubation for 4  days, and required BiPAP last night after extubation. BSE  revealed s/s suspicious for airway compromise.     Assessment / Plan / Recommendation Clinical Impression  Dysphagia Diagnosis: Mild pharyngeal phase dysphagia Clinical impression: Mild sensory based pharyngeal dysphagia,  characterized by delayed swallow reflex on thin liquids.  Intermittent trace flash penetration seen on thin liquid via cup  sip, prevented with small individual sips. Penetration with straw  use was deeper and more in amount, so straw use is discouraged.  Puree and solid consistencies tolerate well without airway  compromise or post-swallow residue. Pt exhibited throat clearing  and weak cough throughout MBS, however, this was noted prior to  po presentation, and in the absense of penetration or aspiration.  Will begin dys 3 diet with thin liquids via cup sip, and follow  to assess diet tolerance.    Treatment Recommendation  Therapy as outlined in treatment plan below    Diet Recommendation Dysphagia 3 (Mechanical Soft);Thin liquid   Liquid Administration via: No straw;Cup Medication Administration: Whole meds with liquid Supervision: Patient able to self feed;Intermittent supervision  to cue for compensatory strategies Compensations: Slow rate;Small sips/bites Postural Changes and/or Swallow Maneuvers: Seated upright 90  degrees    Other  Recommendations Recommended Consults: MBS Oral Care Recommendations: Oral care BID   Follow Up Recommendations  None    Frequency and Duration min 1 x/week  1 week   Pertinent Vitals/Pain VSS, no pain reported    SLP Swallow Goals  die tolerance   General Date of Onset: 03/11/14 HPI: 78 year old male admitted 03/11/14 due to SOB, increasing  weakness. PMH significant for COPD. Pt required intubation for 4  days, and required BiPAP  last night after extubation. BSE  revealed s/s suspicious for airway compromise. Type of Study: Modified Barium Swallowing Study Reason for Referral: Objectively evaluate swallowing function Previous Swallow Assessment: BSE this morning. Pt exhibited  throat clearing and weak cough throughout bedside assessment.  Given recent intubation and history of COPD, MBS was recommended. Diet Prior to this Study: NPO Temperature Spikes Noted: No Respiratory Status: Nasal cannula History of Recent Intubation: Yes Length of Intubations (days): 4 days Date extubated: 03/15/14 Behavior/Cognition: Alert;Cooperative;Pleasant mood Oral Cavity - Dentition: Dentures, top;Dentures, bottom Oral Motor / Sensory Function: Within functional limits Self-Feeding Abilities: Able to feed self Patient Positioning: Upright in chair Baseline Vocal Quality: Clear Volitional Cough: Weak Volitional Swallow: Able to elicit Anatomy: Within functional limits Pharyngeal Secretions:  Not observed secondary MBS    Reason for Referral Objectively evaluate swallowing function   Oral Phase Oral Preparation/Oral Phase Oral Phase: WFL   Pharyngeal Phase Pharyngeal Phase Pharyngeal Phase: Impaired Pharyngeal - Thin Pharyngeal - Thin Cup: Delayed swallow initiation;Premature  spillage to valleculae;Penetration/Aspiration during swallow Penetration/Aspiration details (thin cup): Material does not  enter airway;Material enters airway, remains ABOVE vocal cords  then ejected out Pharyngeal - Thin Straw: Delayed swallow initiation;Premature  spillage to valleculae;Premature spillage to pyriform  sinuses;Penetration/Aspiration during swallow Penetration/Aspiration details (thin straw): Material enters  airway, remains ABOVE vocal cords then ejected out Pharyngeal - Solids Pharyngeal - Puree: Within functional limits Pharyngeal - Multi-consistency: Delayed swallow  initiation;Premature spillage to valleculae Pharyngeal - Pill: Within functional limits  Cervical  Esophageal Phase    GO    Cervical Esophageal Phase Cervical Esophageal Phase: [facility]        Celia B. Murvin Natal [facility], CCC-SLP [phone]  Leigh Aurora 03/16/2014, 1:00 PM     ASSESSMENT / PLAN:  PULMONARY OETT 10/2 >>> A: Acute on chronic hypercarbic and hypoxemic respiratory failure. Hx of COPD on home oxygen. Presumed OSA/OHS. P:   Mandatory  BiPAP qhs, this is a daily night time home regimen for him  F/u CXR as needed- stable to date  Continue scheduled BD's for now  CARDIOVASCULAR A:  Chronic diastolic dysfunction with hx of HTN. Hyperlipidemia. P:  F/u Echo, unclear why not yet performed  Monitor hemodynamics Continue pravachol  Goal even to negative fluid balance (net neg 1L since admission)  ASA  RENAL A:   CKD. Hypokalemia, resolved  P:   Monitor urine outpt, renal fx, electrolytes Restart lasix today Trend BMETs  GASTROINTESTINAL A:   Nutrition. Hx of GERD. P:   Modified barium swallow 10/7- dysphagia 3 diet   Protonix for SUP  HEMATOLOGIC A:   Anemia - chronic per report from daughter. P:  SCD's / Heparin. F/u CBC  INFECTIOUS A:   Tracheobronchitis 10/04. P:   Started rocephin, zithromax 10/04, day 5 of 7 -->PO azithro and keflex to start tomorrow to complete 7 day course   Urine cx 10/02 >> negative Sputum 10/04 >> normal flora  ENDOCRINE A:   Hx of DM with renal complications and neuropathy. Hx of gout.  Appears to be flaring R UE P:   SSI Hold outpt januvia until taking good PO, possibly restart 10/9 Hold allopurinol, start 5 days of prednisone on 10/8  NEUROLOGIC A:   Acute metabolic encephalopathy, improving DM neuropathy. P:   RASS goal: 0 Hold outpt neurontin until taking good PO  Transfer to Telemetry bed today 10/8 Confirmed with accepting floor CIR to see to consider a Rehab stay  Charlane Ferretti, MD Family Medicine PGY-2 Please page or call with questions  Levy Pupa, MD, PhD 03/17/2014, 3:04  PM [place] [facility] [phone] or if no answer [phone]

## 2014-03-17 NOTE — Consult Note (Signed)
Physical Medicine and Rehabilitation Consult  Reason for Consult:  Deconditioning due to acute on chronic hypercarbic/ hypoxemic respiratory failure Referring Physician: Dr. Delton Coombes   HPI:  [name] is a 78 year old male with history of DM type 2, CHF, anemia, COPD with chronic respiratory failure--Oxygen dependent;  who was admitted via [facility] with few week history of progressive weakness with SOB. He was found to have respiratory acidosis and required intubation due to respiratory failure with increasing letharyg. He was transferred to [facility] on 03/11/14 for further treatment and vent wean. On 10/04, he developed fever with increased respiratory secretions and was started on IV antibiotics with improvement. Respiratory culture and urine culture negative.  He was extubated to BIPAP on 10/06 and started on Dysphagia 3 diet with thin liquids due to evidence of mild pharyngeal dysphagia. PT evaluation done yesterday and patient noted to be deconditioned. Rehab team and MD recommending CIR for progressive therapies.   Patient states that wife and niece who is a CNA can help him at home. He thinks he can get stronger at home Used continuous oxygen prior to admission but no assisted device. Discussed case with pulmonary concerns are about noncompliance for BiPAP   Review of Systems  HENT: Negative for hearing loss.   Eyes: Negative for blurred vision and double vision.  Respiratory: Negative for cough and shortness of breath.   Cardiovascular: Negative for chest pain and palpitations.  Gastrointestinal: Negative for heartburn and nausea.  Musculoskeletal: Positive for joint pain (right elbow due to gout flare). Negative for myalgias.  Neurological: Negative for dizziness, tingling and headaches.   Past Medical History  Diagnosis Date  . COPD (chronic obstructive pulmonary disease)   . Chronic respiratory failure   . CHF (congestive heart failure)   . Diabetes mellitus     . Anemia   . Colon polyp    Past Surgical History  Procedure Laterality Date  . Colonoscopy      History reviewed. No pertinent family history.   Social History:  Retired. Lives in [place]. Married and reports that wife can assist pas discharge.  He reports that he has quit smoking 20 years ago. Marland Kitchen He does not have any smokeless tobacco history on file. He does not use alcohol or illicit drugs.   Allergies: No Known Allergies   Medications Prior to Admission  Medication Sig Dispense Refill  . acetaZOLAMIDE (DIAMOX) 500 MG capsule Take 500 mg by mouth every other day.      . albuterol (PROVENTIL) (5 MG/ML) 0.5% nebulizer solution Take 2.5 mg by nebulization every 8 (eight) hours.      Marland Kitchen allopurinol (ZYLOPRIM) 300 MG tablet Take 300 mg by mouth daily.      Marland Kitchen aspirin 81 MG tablet Take 81 mg by mouth daily.      . bisacodyl (BISACODYL) 5 MG EC tablet Take 5 mg by mouth daily.      . ferrous sulfate 325 (65 FE) MG tablet Take 325 mg by mouth daily with breakfast.      . furosemide (LASIX) 40 MG tablet Take 40 mg by mouth daily.      Marland Kitchen gabapentin (NEURONTIN) 300 MG capsule Take 300 mg by mouth at bedtime.      . hydrALAZINE (APRESOLINE) 50 MG tablet Take 50 mg by mouth 2 (two) times daily.      . Multiple Vitamin (MULTIVITAMIN) capsule Take 1 capsule by mouth daily.      Marland Kitchen  omega-3 acid ethyl esters (LOVAZA) 1 G capsule Take by mouth 2 (two) times daily.      Marland Kitchen omeprazole (PRILOSEC) 40 MG capsule Take 40 mg by mouth daily.      . pravastatin (PRAVACHOL) 10 MG tablet Take 10 mg by mouth daily.      . sitaGLIPtin (JANUVIA) 50 MG tablet Take 50 mg by mouth daily.        Home: Home Living Family/patient expects to be discharged to:: Private residence Living Arrangements: Spouse/significant other;Children;Other relatives (niece is CNA) Available Help at Discharge: Family;Available 24 hours/day Type of Home: House Home Access: Stairs to enter [employer] of Steps: 5 Entrance  Stairs-Rails: Right;Left;Can reach both Home Layout: One level Home Equipment: Cane - single point (3LO2 home O2)  Functional History: Prior Function Level of Independence: Independent with assistive device(s) Functional Status:  Mobility: Bed Mobility Overal bed mobility: Needs Assistance;+2 for physical assistance Bed Mobility: Supine to Sit Supine to sit: Mod assist General bed mobility comments: Needed assist for LEs and elevation of trunk.  Transfers Overall transfer level: Needs assistance Equipment used: 2 person hand held assist Transfers: Sit to/from [employer] Sit to Stand: Mod assist;From elevated surface Stand pivot transfers: Mod assist;+2 physical assistance General transfer comment: Pt needed mod assist and use of momentum to stand taking incr time to stand semi upright.  Kept bil LEs flexed but was able to take pivotal steps to chair with assist for weight shift and incr time.   Ambulation/Gait Ambulation/Gait assistance: Mod assist;+2 physical assistance Ambulation Distance (Feet): 3 Feet Assistive device: 2 person hand held assist Gait Pattern/deviations: Step-to pattern;Decreased stride length;Decreased step length - right;Decreased step length - left;Shuffle;Staggering left;Staggering right;Wide base of support Gait velocity interpretation: Below normal speed for age/gender General Gait Details: Pt was able to take a few steps with bil UE support but with great difficulty and needing assist to weight shift.  Pt with flexed posture with posterior lean having difficulty with hip and knee extension with inability to stand upright.      ADL:    Cognition: Cognition Overall Cognitive Status: Within Functional Limits for tasks assessed Orientation Level: Oriented X4 Cognition Arousal/Alertness: Awake/alert Behavior During Therapy: WFL for tasks assessed/performed Overall Cognitive Status: Within Functional Limits for tasks assessed  Blood  pressure 146/51, pulse 91, temperature 98.2 F (36.8 C), temperature source Core (Comment), resp. rate 28, height 5\' 9"  (1.753 m), weight 105.1 kg (231 lb 11.3 oz), SpO2 100.00%. Physical Exam  Nursing note and vitals reviewed. Constitutional: He is oriented to person, place, and time. He appears well-developed and well-nourished. Nasal cannula in place.  Rectal tube and foley in place.  HENT:  Head: Normocephalic and atraumatic.  Eyes: Conjunctivae are normal. Pupils are equal, round, and reactive to light.  Neck: Normal range of motion. Neck supple.  Cardiovascular: Normal rate and regular rhythm.   Respiratory: Effort normal. No respiratory distress. He has wheezes (expiratory).  GI: Soft. Bowel sounds are normal. He exhibits no distension. There is no tenderness.  Musculoskeletal:  2+ edema right wrist/hand. Right elbow and wrist warm to touch and decreased ROM due to pain/edema. Moves BLE and LUE without difficulty.   Neurological: He is alert and oriented to person, place, and time.  Skin: Skin is warm and dry.   motor strength is 4 minus in the right finger flexors and extensors 4 in the right biceps triceps and deltoid 5 minus left deltoid, biceps, triceps, grip 3 minus bilateral hip flexors  4 minus bilateral knee extensors as well as ankle dorsiflexor plantar flexor Proprioception intact in bilateral lower extremities  Results for orders placed during the hospital encounter of 03/11/14 (from the past 24 hour(s))  GLUCOSE, CAPILLARY     Status: Abnormal   Collection Time    03/16/14 12:55 PM      Result Value Ref Range   Glucose-Capillary 137 (*) 70 - 99 mg/dL  GLUCOSE, CAPILLARY     Status: Abnormal   Collection Time    03/16/14  3:43 PM      Result Value Ref Range   Glucose-Capillary 172 (*) 70 - 99 mg/dL  GLUCOSE, CAPILLARY     Status: Abnormal   Collection Time    03/16/14  7:19 PM      Result Value Ref Range   Glucose-Capillary 111 (*) 70 - 99 mg/dL  GLUCOSE,  CAPILLARY     Status: None   Collection Time    03/17/14 12:27 AM      Result Value Ref Range   Glucose-Capillary 94  70 - 99 mg/dL  GLUCOSE, CAPILLARY     Status: None   Collection Time    03/17/14  3:59 AM      Result Value Ref Range   Glucose-Capillary 87  70 - 99 mg/dL  GLUCOSE, CAPILLARY     Status: None   Collection Time    03/17/14  8:04 AM      Result Value Ref Range   Glucose-Capillary 98  70 - 99 mg/dL   Dg Chest Port 1 View  03/16/2014   CLINICAL DATA:  Respiratory failure.  Shortness of breath.  EXAM: PORTABLE CHEST - 1 VIEW  COMPARISON:  03/14/2014.  FINDINGS: There has been interval removal of the enteric tube and nasogastric tube. The lung volumes are low. There are persistent small pleural effusions and mild interstitial edema.  IMPRESSION: 1. No change in CHF pattern.   Electronically Signed   By: Signa Kellaylor  Stroud M.D.   On: 03/16/2014 07:54    Assessment/Plan: Diagnosis: Debility secondary to acute exacerbation of chronic obstructive pulmonary disease with respiratory failure 1. Does the need for close, 24 hr/day medical supervision in concert with the patient's rehab needs make it unreasonable for this patient to be served in a less intensive setting? Yes 2. Co-Morbidities requiring supervision/potential complications:  Gout flareup right wrist and hand, CHF, type 2 diabetes  3. Due to bladder management, bowel management, safety, skin/wound care, disease management, medication administration, pain management and patient education, does the patient require 24 hr/day rehab nursing? Yes 4. Does the patient require coordinated care of a physician, rehab nurse, PT (1-2 hrs/day, 5 days/week) and OT (1-2 hrs/day, 5 days/week) to address physical and functional deficits in the context of the above medical diagnosis(es)? Yes Addressing deficits in the following areas: balance, endurance, locomotion, strength, transferring, bowel/bladder control, bathing, dressing, feeding and  toileting 5. Can the patient actively participate in an intensive therapy program of at least 3 hrs of therapy per day at least 5 days per week? Potentially 6. The potential for patient to make measurable gains while on inpatient rehab is good 7. Anticipated functional outcomes upon discharge from inpatient rehab are modified independent  with PT, modified independent with OT, n/a with SLP. 8. Estimated rehab length of stay to reach the above functional goals is: 10-14d 9. Does the patient have adequate social supports to accommodate these discharge functional goals? Yes 10. Anticipated D/C setting: Home 11. Anticipated post D/C treatments:  HH therapy 12. Overall Rehab/Functional Prognosis: excellent  RECOMMENDATIONS: This patient's condition is appropriate for continued rehabilitative care in the following setting: CIR Patient has agreed to participate in recommended program. Patient would rather go home. He will discuss this with his wife Note that insurance prior authorization may be required for reimbursement for recommended care.  Comment:  See above will need to confirm that patient is agreeable for in-hospital rehabilitation    03/17/2014

## 2014-03-17 NOTE — Progress Notes (Signed)
Speech Language Pathology Treatment: Dysphagia  Patient Details Name: Douglas Rogers MRN: [id number] DOB: [birth year] Today's Date: 03/17/2014 Time: 1050-1100 SLP Time Calculation (min): 10 min  Assessment / Plan / Recommendation Clinical Impression  F/u after yesterday's MBS - Pt eating well with adequate mastication, + airway protection, no observation of difficulty with POs.  RR within range for sufficient swallow/ventilatory coordination.  No SLP f/u recommended - advance diet to regular/thin liquids.     HPI HPI: 78 year old male admitted 03/11/14 due to SOB, increasing weakness. PMH significant for COPD. Pt required intubation for 4 days, and required BiPAP last night after extubation. BSE revealed s/s suspicious for airway compromise.   Pertinent Vitals Pain Assessment: No/denies pain Pain Score: 5  Pain Location: right elbow Pain Descriptors / Indicators: Constant;Aching Pain Intervention(s): Limited activity within patient's tolerance;Monitored during session;Premedicated before session;Repositioned  SLP Plan  All goals met    Recommendations Diet recommendations: Regular;Thin liquid Liquids provided via: Cup Medication Administration: Whole meds with liquid Supervision: Patient able to self feed Compensations: Slow rate;Small sips/bites Postural Changes and/or Swallow Maneuvers: Seated upright 90 degrees              Oral Care Recommendations: Oral care BID Follow up Recommendations: None Plan: All goals met   Lorretta Kerce L. Tivis Ringer, [place] CCC/SLP Pager [phone]      Douglas Rogers 03/17/2014, 11:09 AM

## 2014-03-17 NOTE — Progress Notes (Signed)
Physical Therapy Treatment Patient Details Name: Douglas Rogers MRN: [id number] DOB: 10-Sep-1935 Today's Date: 03/17/2014    History of Present Illness   78 year old male admitted 03/11/14 due to SOB, increasing weakness. PMH significant for COPD. Pt required intubation for 4 days, and required BiPAP last night after extubation.     PT Comments    Pt admitted with above. Pt currently with functional limitations due to balance, strength and endurance deficits.   Pt will benefit from skilled PT to increase their independence and safety with mobility to allow discharge to the venue listed below.    Follow Up Recommendations  CIR;Supervision/Assistance - 24 hour     Equipment Recommendations  Other (comment) (TBA)    Recommendations for Other Services Rehab consult     Precautions / Restrictions Precautions Precautions: Fall Restrictions Weight Bearing Restrictions: No    Mobility  Bed Mobility Overal bed mobility: Needs Assistance;+2 for physical assistance Bed Mobility: Supine to Sit     Supine to sit: Mod assist     General bed mobility comments: Needed assist for LEs and elevation of trunk.   Transfers Overall transfer level: Needs assistance Equipment used: 2 person hand held assist Transfers: Sit to/from [employer] CorporationStand;Stand Pivot Transfers Sit to Stand: Mod assist;From elevated surface Stand pivot transfers: Mod assist;+2 physical assistance       General transfer comment: Pt needed mod assist and use of momentum to stand taking incr time to stand semi upright.  Kept bil LEs flexed but was able to take pivotal steps to chair with assist for weight shift and incr time.    Ambulation/Gait Ambulation/Gait assistance: Mod assist;+2 physical assistance Ambulation Distance (Feet): 3 Feet Assistive device: 2 person hand held assist Gait Pattern/deviations: Step-to pattern;Decreased stride length;Decreased step length - right;Decreased step length - left;Shuffle;Staggering  left;Staggering right;Wide base of support   Gait velocity interpretation: Below normal speed for age/gender General Gait Details: Pt was able to take a few steps with bil UE support but with great difficulty and needing assist to weight shift.  Pt with flexed posture with posterior lean having difficulty with hip and knee extension with inability to stand upright.     Stairs            Wheelchair Mobility    Modified Rankin (Stroke Patients Only)       Balance Overall balance assessment: Needs assistance;History of Falls Sitting-balance support: Bilateral upper extremity supported;Feet supported Sitting balance-Leahy Scale: Fair   Postural control: Posterior lean Standing balance support: Bilateral upper extremity supported;During functional activity Standing balance-Leahy Scale: Poor Standing balance comment: Needed bil UE support for standing.  Flexed posture and unable to stand fully upright.                      Cognition Arousal/Alertness: Awake/alert Behavior During Therapy: WFL for tasks assessed/performed Overall Cognitive Status: Within Functional Limits for tasks assessed                      Exercises General Exercises - Lower Extremity Ankle Circles/Pumps: AROM;Both;5 reps;Supine Long Arc Quad: AROM;Both;10 reps;Seated    General Comments General comments (skin integrity, edema, etc.): gout in right UE limiting UE use      Pertinent Vitals/Pain Pain Assessment: 0-10 Pain Score: 5  Pain Location: right elbow Pain Descriptors / Indicators: Constant;Aching Pain Intervention(s): Limited activity within patient's tolerance;Monitored during session;Premedicated before session;Repositioned VSS.  O2 on RA with activity 82-89%.  Sats 92-98% with O2  at 3L in place.  Other VSS.      Home Living                      Prior Function            PT Goals (current goals can now be found in the care plan section) Progress towards PT  goals: Progressing toward goals    Frequency  Min 3X/week    PT Plan Current plan remains appropriate    Co-evaluation             End of Session Equipment Utilized During Treatment: Gait belt;Oxygen Activity Tolerance: Patient limited by fatigue;Patient limited by pain Patient left: in chair;with call bell/phone within reach     Time: 5366-4403 PT Time Calculation (min): 26 min  Charges:  $Therapeutic Activity: 8-22 mins $Self Care/Home Management: 8-22                    G CodesBerline Lopes 04/09/14, 10:12 AM [facility] [phone] (pager)

## 2014-03-17 NOTE — Care Management Note (Addendum)
Page 1 of 2   03/23/2014     12:02:11 PM CARE MANAGEMENT NOTE 03/23/2014  Patient:  Douglas Rogers, Douglas Rogers   Account Number:  [id number]  Date Initiated:  03/16/2014  Documentation initiated by:  Junius Creamer  Subjective/Objective Assessment:   adm w resp failure-vent     Action/Plan:   lives w wife, pcp dr Ignacia Palma   Anticipated DC Date:  03/19/2014   Anticipated DC Plan:  HOME W HOME HEALTH SERVICES      DC Planning Services  CM consult      PAC Choice  DURABLE MEDICAL EQUIPMENT  HOME HEALTH   Choice offered to / List presented to:  C-4 Adult Children   DME arranged  BEDSIDE COMMODE  BIPAP      DME agency  [facility]     HH arranged  HH-2 PT  HH-4 NURSE'S AIDE      HH agency  [facility]   Status of service:  Completed, signed off Medicare Important Message given?  YES (If response is "NO", the following Medicare IM given date fields will be blank) Date Medicare IM given:  03/17/2014 Medicare IM given by:  Junius Creamer Date Additional Medicare IM given:  03/18/2014 Additional Medicare IM given by:  Douglas Rogers  Discharge Disposition:  AGAINST MEDICAL ADVICE  Per UR Regulation:  Reviewed for med. necessity/level of care/duration of stay  If discussed at Long Length of Stay Meetings, dates discussed:   03/17/2014    Comments:  03/23/14 11:55 CM received call from daughter of pt who states the medical record does not reflect an accurate accounting of the leaving of the pt; however, she did not have the records in front of her to give any specifics.  CM suggested she call the main number and request to speak to someone in Medical Records or ask for a dept which deals with Patient Concerns.  Douglas Rogers states thank you and she will call.  No other CM needs were communicated. Douglas Rogers, BSN, [place] [phone]. 03/19/14 16:25 CM spoke with Douglas Rogers [phone] to discuss discharge needs.  Pt is on 4LNC here at the hospital and  lives in [place], [place].  Douglas Rogers states she is going to bring enough tanks to the hospital to get her father home with oxygen. CM explained to pt on a 3-4LNC flow rate - that is multiple tanks.  Douglas Rogers states her sister can bring her father half way and then she can take hime therest of the way.   CM discussed it may be better to have an ambulance transport her father back home but Douglas Rogers states her father would never go for an ambulance back home.  CM spoke with pt who agrees to go home by ambulance. CM notifies RN who states DC orders have not been written. CM speaks to Dr. Sherene Sires who states pt needs BiPaP and discharge is not safe at this time. CM notifies RN.  CM receives a call from Douglas Rogers daughter (not Douglas Rogers but daughter who is visiting at hospital) and I explain MD feels pt is not ready for discharge.  Daughter states she is coming back to hospital and she will tell her father. CM goes to unit to wait for daughter.  When CM arrives, pt has requested to sign out AMA and charge and RN accomplishing this task with the pt. CM asks daughter how will pt get back home with oxygen and she staes Douglas Rogers is bringing the oxygen.   Tammy, PA  calls and CM brings her up to date.  No other CM needs were communicated.  Douglas JakschSarah Layce Sprung, BSN, [place] [phone].   03/18/14- 1530- Douglas PieriniKristi Webster RN, BSN [phone] Per pt'd daughter do not desire to go to CIR but [facility] for [facility]-PT/aide along with DME for BIPAP and BSC- orders fax[facility]- [phone]-

## 2014-03-17 NOTE — Progress Notes (Signed)
NUTRITION FOLLOW-UP  DOCUMENTATION CODES Per approved criteria  -Obesity Unspecified   INTERVENTION:  Ensure Pudding PO TID, each supplement provides 170 kcal and 4 grams of protein  NUTRITION DIAGNOSIS: Inadequate oral intake related to dysphagia as evidenced by </= 50% meal completion. Ongoing.  Goal: Intake to meet >90% of estimated nutrition needs. Unmet.  Monitor:  PO intake, labs, weight trend.  ASSESSMENT: 78 y.o. M with PMH of COPD, HTN, CHF and who presented to [facility] ED with SOB. Taken to [facility] ED where he presented for SOB. He was placed on BiPAP but failed and subsequently required intubation.  Patient was extubated on 10/6. Diet has been advanced to dysphagia 3 with thin liquids. SLP in room with patient for swallowing assessment follow-up. Patient says he is eating well, had oatmeal and applesauce for breakfast. Consumed ~50% of breakfast today.  Height: Ht Readings from Last 1 Encounters:  03/11/14 5\' 9"  (1.753 m)    Weight: Wt Readings from Last 1 Encounters:  03/17/14 231 lb 11.3 oz (105.1 kg)  Admit wt 236 lb on 10/2 --> wt trending down likely 2/2 lasix  BMI:  Body mass index is 34.2 kg/(m^2). Obese Class I  Estimated Nutritional Needs: Kcal: 1900-2100 Protein: 110-130 gm Fluid: >/= 2 L  Skin: intact  Diet Order: Dysphagia 3 with thin liquids    Intake/Output Summary (Last 24 hours) at 03/17/14 1052 Last data filed at 03/17/14 0700  Gross per 24 hour  Intake    830 ml  Output   1550 ml  Net   -720 ml    Last BM: flexiseal in place for ongoing loose BMs  Labs:   Recent Labs Lab 03/14/14 0526 03/15/14 0225 03/16/14 0235  NA 139 145 147  K 3.1* 3.4* 3.8  CL 99 103 105  CO2 28 30 28   BUN 43* 61* 59*  CREATININE 2.18* 2.38* 1.96*  CALCIUM 8.0* 8.0* 8.7  MG 2.2 2.5 2.5  PHOS 4.1 4.8* 4.0  GLUCOSE 190* 144* 121*    CBG (last 3)   Recent Labs  03/17/14 0027 03/17/14 0359 03/17/14 0804  GLUCAP 94 87 98    Triglycerides  Date/Time Value Ref Range Status  03/14/2014  5:26 AM 87  <150 mg/dL Final   CBC    Component Value Date/Time   WBC 10.2 03/16/2014 0235   RBC 2.93* 03/16/2014 0235   HGB 7.6* 03/16/2014 0235   HCT 25.9* 03/16/2014 0235   PLT 276 03/16/2014 0235   MCV 88.4 03/16/2014 0235   MCH 25.9* 03/16/2014 0235   MCHC 29.3* 03/16/2014 0235   RDW 19.3* 03/16/2014 0235     Scheduled Meds: . allopurinol  300 mg Per Tube Daily  . antiseptic oral rinse  7 mL Mouth Rinse q12n4p  . aspirin  81 mg Per Tube Daily  . azithromycin  500 mg Intravenous Q24H  . cefTRIAXone (ROCEPHIN)  IV  1 g Intravenous Q24H  . chlorhexidine  15 mL Mouth Rinse BID  . heparin  5,000 Units Subcutaneous 3 times per day  . Influenza vac split quadrivalent PF  0.5 mL Intramuscular Tomorrow-1000  . insulin aspart  0-20 Units Subcutaneous 6 times per day  . ipratropium-albuterol  3 mL Nebulization Q6H  . pantoprazole  40 mg Oral Daily  . pneumococcal 23 valent vaccine  0.5 mL Intramuscular Tomorrow-1000  . potassium chloride  40 mEq Oral Daily  . pravastatin  10 mg Per Tube Daily  . sodium chloride  3 mL Intravenous Q12H  Continuous Infusions:     Joaquin CourtsKimberly Harris, RD, LDN, CNSC Pager [phone] After Hours Pager [phone]

## 2014-03-18 DIAGNOSIS — J9620 Acute and chronic respiratory failure, unspecified whether with hypoxia or hypercapnia: Secondary | ICD-10-CM

## 2014-03-18 LAB — GLUCOSE, CAPILLARY
Glucose-Capillary: 112 mg/dL — ABNORMAL HIGH (ref 70–99)
Glucose-Capillary: 141 mg/dL — ABNORMAL HIGH (ref 70–99)
Glucose-Capillary: 119 mg/dL — ABNORMAL HIGH (ref 70–99)
Glucose-Capillary: 149 mg/dL — ABNORMAL HIGH (ref 70–99)

## 2014-03-18 LAB — CLOSTRIDIUM DIFFICILE BY PCR: Toxigenic C. Difficile by PCR: POSITIVE — AB

## 2014-03-18 MED ORDER — FAMOTIDINE 20 MG PO TABS
20.0000 mg | ORAL_TABLET | Freq: Every day | ORAL | Status: DC
  Administered 2014-03-18: 20 mg via ORAL
  Filled 2014-03-18 (×2): qty 1

## 2014-03-18 MED ORDER — ADULT MULTIVITAMIN W/MINERALS CH
1.0000 | ORAL_TABLET | Freq: Every day | ORAL | Status: DC
  Administered 2014-03-18 – 2014-03-19 (×2): 1 via ORAL
  Filled 2014-03-18 (×2): qty 1

## 2014-03-18 MED ORDER — LINAGLIPTIN 5 MG PO TABS
5.0000 mg | ORAL_TABLET | Freq: Every day | ORAL | Status: DC
  Administered 2014-03-18 – 2014-03-19 (×2): 5 mg via ORAL
  Filled 2014-03-18 (×2): qty 1

## 2014-03-18 MED ORDER — ACETAZOLAMIDE ER 500 MG PO CP12
500.0000 mg | ORAL_CAPSULE | ORAL | Status: DC
  Filled 2014-03-18: qty 1

## 2014-03-18 MED ORDER — VANCOMYCIN 50 MG/ML ORAL SOLUTION
125.0000 mg | Freq: Four times a day (QID) | ORAL | Status: DC
  Administered 2014-03-18 – 2014-03-19 (×5): 125 mg via ORAL
  Filled 2014-03-18 (×8): qty 2.5

## 2014-03-18 MED ORDER — METRONIDAZOLE 500 MG PO TABS
500.0000 mg | ORAL_TABLET | Freq: Three times a day (TID) | ORAL | Status: DC
  Filled 2014-03-18 (×3): qty 1

## 2014-03-18 MED ORDER — MULTIVITAMINS PO CAPS: 1.0000 | ORAL_CAPSULE | Freq: Every day | ORAL | Status: DC

## 2014-03-18 MED ORDER — BISACODYL 5 MG PO TBEC
5.0000 mg | DELAYED_RELEASE_TABLET | Freq: Every day | ORAL | Status: DC
  Administered 2014-03-18: 5 mg via ORAL
  Filled 2014-03-18 (×2): qty 1

## 2014-03-18 MED ORDER — FERROUS SULFATE 325 (65 FE) MG PO TABS
325.0000 mg | ORAL_TABLET | Freq: Every day | ORAL | Status: DC
  Administered 2014-03-19: 325 mg via ORAL
  Filled 2014-03-18 (×2): qty 1

## 2014-03-18 MED ORDER — LIDOCAINE HCL 1 % IJ SOLN
10.0000 mL | Freq: Once | INTRAMUSCULAR | Status: DC
  Filled 2014-03-18 (×2): qty 10

## 2014-03-18 MED ORDER — HYDRALAZINE HCL 50 MG PO TABS
50.0000 mg | ORAL_TABLET | Freq: Two times a day (BID) | ORAL | Status: DC
  Administered 2014-03-18 – 2014-03-19 (×3): 50 mg via ORAL
  Filled 2014-03-18 (×5): qty 1

## 2014-03-18 NOTE — Discharge Summary (Signed)
Physician Discharge Summary  Patient ID: Bernd Crom MRN: [id number] DOB/AGE: 09/08/1935 78 y.o.  Admit date: 03/11/2014 Discharge date: 03/18/2014    Discharge Diagnoses:  Acute on Chronic Hypercarbic & Hypoxemic Respiratory Failure Tracheobronchitis  COPD  Oxygen Dependence OSA / OHS Chronic Diastolic Dysfunction HTN Hyperlipidemia  CKD Hypokalemia GERD C-Difficile  Penile Edema  Anemia  Diabetes Mellitus  Gout Acute Metabolic Encephalopathy  Diabetes Neuropathy  Deconditioning                                                                       DISCHARGE PLAN BY DIAGNOSIS     Acute on Chronic Hypercarbic & Hypoxemic Respiratory Failure Tracheobronchitis  COPD  Oxygen Dependence OSA / OHS  Discharge Plan: -Follow up with primary Pulmonary in [place], Dr. Farris Has -Needs sleep study, defer to primary pulmonary  -Arrange for home bipap -Continue O2 3-6 L as previously arranged  -Completed abx for tracheobronchitis while inpatient   Chronic Diastolic Dysfunction HTN Hyperlipidemia   Discharge Plan: -Continue ASA, Pravachol -Hold hydralazine, diamox -Continue lasix 40 mg QD  CKD Hypokalemia  Discharge Plan: -Will need follow up BMP with PCP to review sr cr -hold nephrotoxic agents as above  GERD C-Difficile  Penile Edema   Discharge Plan: Urology consulted and recommended no further acute interventions Family educated on hand washing, bleach for clean up at home Pt to complete 14 days oral vancomycin   Anemia   Discharge Plan: -Continue Ferrous Sulfate -Pt to follow up with Hematology as previously arranged  Diabetes Mellitus  Gout  Discharge Plan: -Complete 5 days of prednisone for acute gout flare -Hold allopurinol with CKD & Vanco dosing, consider restart at time of BMP review with PCP  Acute Metabolic Encephalopathy  Diabetes Neuropathy  Deconditioning   Discharge Plan: -Arrange for home PT, Aide, bedside commode             DISCHARGE SUMMARY   Jawuan Robb is a 78 y.o. y/o male with a PMH of DM, Aneimia, CHF, 3L O2 dependent COPD who presented to [facility] on 10/2 with ~ 2 weeks of progressive weakness and increased sleepiness.  He recently underwent a colonoscopy due to  anemia (on EPO) and was reportedly negative (per daughter who is a Marine scientist).  Initial work up in the ER was notable for respiratory acidosis and he was treated with BiPAP without improvement.  He had increased lethargy requiring intubation and was subsequently transferred to [facility] for further care.   Upon arrival to [facility], evaluation notable for hypokalemia (3.1), elevated sr cr (2.68), hypophosphatemia (1.9), protein calorie malnutrition (albumin 2.6), proBNP 2750, and anemia (hgb 7.7).  He was admitted to ICU, treated with nebulized bronchodilators and mechanical ventilation.  He developed fever and increased respiratory secretions with a negative CXR thought to be tracheobronchitis and was treated with azithromycin & rocephin.  Sputum & blood cultures are negative to date.  The patient met extubation criteria on 10/6 and was liberated from mechanical ventilation.  He made slow progression and was transferred out to the medical floor on 10/8.  Home lasix was restarted.  Hydralazine was restarted prior to discharge.  10/9 he was noted to have diarrhea and C-Diff was assessed and positive.  Oral vancomycin  was started for a planned 14 days treatment.  He was evaluated by CIR for rehab but family declined as they felt they could care for him at home.  He was evaluated by Urology for a retracted foreskin and recommended proper reduction of foreskin, follow up with Urology in [place] as needed.   See discharge plans as above.    **Patient was set up for home bipap prior to discharge and [place].  However, daughter refused ambulance transport home.  He left before patient could obtain prescriptions at time of  discharge.    SIGNIFICANT EVENTS / STUDIES:  10/2  Presented to [facility] ED, intubated for respiratory failure, transferred to [facility]  10/4  Fever, increased respiratory secretions 10/6  Extubated  10/8  Tx to medical floor 10/9  Diarrhea, C-diff positive.  Started on vancomycin  10/9  ECHO >>  MICRO DATA  10/2  MRSA PCR >> neg 10/2  UC >> neg 10/4  Resp culture >> normal flora 10/8  C-Diff PCR >> POSITIVE   ANTIBIOTICS Azithromycin 10/4 >>10/9 Rocephin 10/4 >>10/8 Keflex 10/8 >> 10/10 Vanco (oral) 10/9 >>  to complete 14 days, stop date 10/22   CONSULTS Urology  [place] / LINES OETT 10/2 >>>10/6    Discharge Exam: Pt left AMA.    Filed Vitals:   03/17/14 2117 03/18/14 0056 03/18/14 0500 03/18/14 0915  BP: 165/66  166/71   Pulse: 97  86 90  Temp: 98.1 F (36.7 C)  97.8 F (36.6 C)   TempSrc: Axillary  Axillary   Resp: $Remo'15  18 18  'RYnvW$ Height:      Weight:   226 lb 1.6 oz (102.558 kg)   SpO2: 93% 90% 100% 97%     Discharge Labs  BMET  Recent Labs Lab 03/12/14 0252 03/12/14 0742 03/13/14 0230 03/14/14 0526 03/15/14 0225 03/16/14 0235  NA 140  --  141 139 145 147  K 3.0*  --  2.9* 3.1* 3.4* 3.8  CL 98  --  99 99 103 105  CO2 32  --  32 $R'28 30 28  'dq$ GLUCOSE 156*  --  144* 190* 144* 121*  BUN 26*  --  32* 43* 61* 59*  CREATININE 2.38*  --  2.26* 2.18* 2.38* 1.96*  CALCIUM 8.7  --  8.3* 8.0* 8.0* 8.7  MG  --  1.5 1.9 2.2 2.5 2.5  PHOS  --  2.1* 3.6 4.1 4.8* 4.0   CBC  Recent Labs Lab 03/14/14 0526 03/15/14 0225 03/16/14 0235  HGB 7.7* 7.0* 7.6*  HCT 25.7* 23.7* 25.9*  WBC 11.9* 11.4* 10.2  PLT 256 268 276    Follow-up Information   Follow up with DAVIDSON,ERIC, MD On 04/06/2014. (Appt at 9:15.  Please call once you are home to see if they have any cancellations.  This is the earliest appt they currently available.  )    Specialty:  Internal Medicine   Contact information:   [address] [place]  [address] [phone]       Schedule an appointment as soon as possible for a visit with Vicente Males, MD. (Follow up for sleep apnea. )    Specialty:  Pulmonary Disease   Contact information:   [place] [address] [phone]       PREVIOUS HOME MEDICATIONS, PT LEFT AMA    Medication List    ASK your doctor about these medications  acetaZOLAMIDE 500 MG capsule  Commonly known as:  DIAMOX  Take 500 mg by mouth every other day.     albuterol (5 MG/ML) 0.5% nebulizer solution  Commonly known as:  PROVENTIL  Take 2.5 mg by nebulization every 8 (eight) hours.     allopurinol 300 MG tablet  Commonly known as:  ZYLOPRIM  Take 300 mg by mouth daily.     aspirin 81 MG tablet  Take 81 mg by mouth daily.     bisacodyl 5 MG EC tablet  Generic drug:  bisacodyl  Take 5 mg by mouth daily.     ferrous sulfate 325 (65 FE) MG tablet  Take 325 mg by mouth daily with breakfast.     furosemide 40 MG tablet  Commonly known as:  LASIX  Take 40 mg by mouth daily.     gabapentin 300 MG capsule  Commonly known as:  NEURONTIN  Take 300 mg by mouth at bedtime.     hydrALAZINE 50 MG tablet  Commonly known as:  APRESOLINE  Take 50 mg by mouth 2 (two) times daily.     multivitamin capsule  Take 1 capsule by mouth daily.     omega-3 acid ethyl esters 1 G capsule  Commonly known as:  LOVAZA  Take by mouth 2 (two) times daily.     omeprazole 40 MG capsule  Commonly known as:  PRILOSEC  Take 40 mg by mouth daily.     pravastatin 10 MG tablet  Commonly known as:  PRAVACHOL  Take 10 mg by mouth daily.     sitaGLIPtin 50 MG tablet  Commonly known as:  JANUVIA  Take 50 mg by mouth daily.          Disposition:  Home.  Family declined CIR placement.  Arranged for home PT, Aide and bedside commode.  Pt left AMA.   Discharged Condition: Kavon Valenza has met maximum benefit of inpatient care and is medically stable and cleared for discharge.   Patient is pending follow up as above.      Time spent on disposition:  Greater than 35 minutes.   Signed:  Noe Gens, NP-C [facility] Pgr: [phone] or [phone]  Discharge note completed post d/c.  Patient left AMA before home needs could be arranged.  No prescriptions given at discharge as patient left before they could be given to him.

## 2014-03-18 NOTE — Progress Notes (Signed)
SATURATION QUALIFICATIONS: (This note is used to comply with regulatory documentation for home oxygen)  Patient Saturations on Room Air at Rest = 85%  Patient Saturations on Room Air while Ambulating =   Patient Saturations on  Liters of oxygen while Ambulating =   Please briefly explain why patient needs home oxygen:      Patient sitting. Oxygen removed. Oxygen sats 85%. Reapplied 3L of Oxygen and sats were 96% Burchett, Valente DavidMeredith J, RN

## 2014-03-18 NOTE — Progress Notes (Signed)
Dr. Marchelle Gearingamaswamy notified patient positive C-diff, will give new orders. Emelda Brothershristy Lisaanne Lawrie RN

## 2014-03-18 NOTE — Progress Notes (Signed)
PULMONARY / CRITICAL CARE MEDICINE   Name: Douglas Rogers MRN: [id number] DOB: 1936-06-07    ADMISSION DATE:  03/11/2014 CONSULTATION DATE:  03/18/2014  REFERRING MD :  [facility]  CHIEF COMPLAINT:  SOB  INITIAL PRESENTATION: 78 y/o M [facility] ED where he presented for SOB.  He was placed on BiPAP but failed and subsequently required intubation.  He was transferred to [facility] ICU and PCCM was consulted for admission.  STUDIES:   SIGNIFICANT EVENTS: 10/2  presented to [facility] ED, intubated for respiratory failure, transferred to [facility] 10/4  fever, increased respiratory secretions  SUBJECTIVE: Pt denies acute complaints, no distress.  Reports he wore bipap 6 hours last night (although, no notes from RT?).   VITAL SIGNS: Temp:  [97.8 F (36.6 C)-99.4 F (37.4 C)] 97.8 F (36.6 C) (10/09 0500) Pulse Rate:  [86-104] 86 (10/09 0500) Resp:  [15-31] 18 (10/09 0500) BP: (149-166)/(56-71) 166/71 mmHg (10/09 0500) SpO2:  [90 %-100 %] 100 % (10/09 0500) FiO2 (%):  [3 %] 3 % (10/08 0900) Weight:  [226 lb 1.6 oz (102.558 kg)] 226 lb 1.6 oz (102.558 kg) (10/09 0500)  VENTILATOR SETTINGS: Vent Mode:  [-]  FiO2 (%):  [3 %] 3 %  INTAKE / OUTPUT: Intake/Output     10/08 0701 - 10/09 0700 10/09 0701 - 10/10 0700   P.O. 480    I.V. (mL/kg) 30 (0.3)    IV Piggyback     Total Intake(mL/kg) 510 (5)    Urine (mL/kg/hr) 1210 (0.5)    Total Output 1210     Net -700            PHYSICAL EXAMINATION: General: elderly male in no distress Neuro: awake and alert, MAE HEENT: Op clear, no lesions, no jvd Cardiovascular: regular   Lungs: even/non-labored breathing, lungs bilaterally diminished, few scattered wheezes Abdomen: soft, non tender Musculoskeletal: ankle edema Skin:  no rashes GU: penile edema, foley cath  LABS:  CBC  Recent Labs Lab 03/14/14 0526 03/15/14 0225 03/16/14 0235  WBC 11.9* 11.4* 10.2  HGB 7.7* 7.0* 7.6*  HCT 25.7* 23.7* 25.9*  PLT 256 268 276    BMET  Recent Labs Lab 03/14/14 0526 03/15/14 0225 03/16/14 0235  NA 139 145 147  K 3.1* 3.4* 3.8  CL 99 103 105  CO2 28 30 28   BUN 43* 61* 59*  CREATININE 2.18* 2.38* 1.96*  GLUCOSE 190* 144* 121*   Electrolytes  Recent Labs Lab 03/14/14 0526 03/15/14 0225 03/16/14 0235  CALCIUM 8.0* 8.0* 8.7  MG 2.2 2.5 2.5  PHOS 4.1 4.8* 4.0   ABG  Recent Labs Lab 03/12/14 0315  PHART 7.443  PCO2ART 48.3*  PO2ART 80.9   Glucose  Recent Labs Lab 03/17/14 0359 03/17/14 0804 03/17/14 1217 03/17/14 1633 03/17/14 2216 03/18/14 0749  GLUCAP 87 98 146* 138* 133* 112*     No results found for this basename: PROCALCITON,  in the last 168 hours  Imaging Dg Swallowing Func-speech Pathology  03/16/2014   Gray Bernhardt, CCC-SLP     03/16/2014  1:01 PM Objective Swallowing Evaluation: Modified Barium Swallowing Study   Patient Details  Name: Douglas Rogers MRN: [id number] Date of Birth: [birth year]  Today's Date: 03/16/2014 Time: 6295-2841 SLP Time Calculation (min): 30 min  Past Medical History:  Past Medical History  Diagnosis Date  . COPD (chronic obstructive pulmonary disease)   . Chronic respiratory failure   . CHF (congestive heart failure)   . Diabetes mellitus   .  Anemia   . Colon polyp    Past Surgical History:  Past Surgical History  Procedure Laterality Date  . Colonoscopy     HPI:  78 year old male admitted 03/11/14 due to SOB, increasing  weakness. PMH significant for COPD. Pt required intubation for 4  days, and required BiPAP last night after extubation. BSE  revealed s/s suspicious for airway compromise.     Assessment / Plan / Recommendation Clinical Impression  Dysphagia Diagnosis: Mild pharyngeal phase dysphagia Clinical impression: Mild sensory based pharyngeal dysphagia,  characterized by delayed swallow reflex on thin liquids.  Intermittent trace flash penetration seen on thin liquid via cup  sip, prevented with small individual sips. Penetration with straw  use was deeper  and more in amount, so straw use is discouraged.  Puree and solid consistencies tolerate well without airway  compromise or post-swallow residue. Pt exhibited throat clearing  and weak cough throughout MBS, however, this was noted prior to  po presentation, and in the absense of penetration or aspiration.  Will begin dys 3 diet with thin liquids via cup sip, and follow  to assess diet tolerance.    Treatment Recommendation  Therapy as outlined in treatment plan below    Diet Recommendation Dysphagia 3 (Mechanical Soft);Thin liquid   Liquid Administration via: No straw;Cup Medication Administration: Whole meds with liquid Supervision: Patient able to self feed;Intermittent supervision  to cue for compensatory strategies Compensations: Slow rate;Small sips/bites Postural Changes and/or Swallow Maneuvers: Seated upright 90  degrees    Other  Recommendations Recommended Consults: MBS Oral Care Recommendations: Oral care BID   Follow Up Recommendations  None    Frequency and Duration min 1 x/week  1 week   Pertinent Vitals/Pain VSS, no pain reported    SLP Swallow Goals  die tolerance   General Date of Onset: 03/11/14 HPI: 78 year old male admitted 03/11/14 due to SOB, increasing  weakness. PMH significant for COPD. Pt required intubation for 4  days, and required BiPAP last night after extubation. BSE  revealed s/s suspicious for airway compromise. Type of Study: Modified Barium Swallowing Study Reason for Referral: Objectively evaluate swallowing function Previous Swallow Assessment: BSE this morning. Pt exhibited  throat clearing and weak cough throughout bedside assessment.  Given recent intubation and history of COPD, MBS was recommended. Diet Prior to this Study: NPO Temperature Spikes Noted: No Respiratory Status: Nasal cannula History of Recent Intubation: Yes Length of Intubations (days): 4 days Date extubated: 03/15/14 Behavior/Cognition: Alert;Cooperative;Pleasant mood Oral Cavity - Dentition: Dentures,  top;Dentures, bottom Oral Motor / Sensory Function: Within functional limits Self-Feeding Abilities: Able to feed self Patient Positioning: Upright in chair Baseline Vocal Quality: Clear Volitional Cough: Weak Volitional Swallow: Able to elicit Anatomy: Within functional limits Pharyngeal Secretions: Not observed secondary MBS    Reason for Referral Objectively evaluate swallowing function   Oral Phase Oral Preparation/Oral Phase Oral Phase: WFL   Pharyngeal Phase Pharyngeal Phase Pharyngeal Phase: Impaired Pharyngeal - Thin Pharyngeal - Thin Cup: Delayed swallow initiation;Premature  spillage to valleculae;Penetration/Aspiration during swallow Penetration/Aspiration details (thin cup): Material does not  enter airway;Material enters airway, remains ABOVE vocal cords  then ejected out Pharyngeal - Thin Straw: Delayed swallow initiation;Premature  spillage to valleculae;Premature spillage to pyriform  sinuses;Penetration/Aspiration during swallow Penetration/Aspiration details (thin straw): Material enters  airway, remains ABOVE vocal cords then ejected out Pharyngeal - Solids Pharyngeal - Puree: Within functional limits Pharyngeal - Multi-consistency: Delayed swallow  initiation;Premature spillage to valleculae Pharyngeal - Pill: Within functional  limits  Cervical Esophageal Phase    GO    Cervical Esophageal Phase Cervical Esophageal Phase: [facility]        Celia B. Murvin NatalBueche, [facility] Surgery CenterMSP, CCC-SLP [phone]  Leigh AuroraBueche, Celia Brown 03/16/2014, 1:00 PM     ASSESSMENT / PLAN:  PULMONARY OETT 10/2 >>>10/6  A: Acute on chronic hypercarbic and hypoxemic respiratory failure. Hx of COPD on home oxygen. Presumed OSA/OHS.  P:   Mandatory BiPAP qhs, this is a daily night time home regimen for him.  Pt does not have a machine at home.  He has refused in the past, has never had a sleep study.  Pt willing to have overnight oximetry in home.  Has Pulmonologist (Dr. Malachi Prohomas [place]     F/u CXR as needed Continue  scheduled BD's for now Home oxygen as previously arranged   CARDIOVASCULAR A:  Chronic diastolic dysfunction with hx of HTN. Hyperlipidemia. P:  F/u Echo, unclear why not yet performed.  Re-ordered it 10/9 but originally ordered 10/2 Continue pravachol  Goal even to negative fluid balance Hold home diamox ASA Resume hydralazine   RENAL A:   CKD. Hypokalemia, resolved  P:   Monitor urine outpt, renal fx, electrolytes Continue lasix  Trend BMETs  GASTROINTESTINAL / GU A:   Nutrition. Hx of GERD. Penile Edema - foreskin retraction & unable to reduce P:   Modified barium swallow 10/7- dysphagia 3 diet   Protonix for SUP Attempted to adjust foreskin but unable due to swelling.  Have asked Urology to evaluate.   HEMATOLOGIC A:   Anemia - chronic per report from daughter. P:  SCD's / Heparin. F/u CBC  INFECTIOUS A:   Tracheobronchitis 10/04. C-Diff - mild, WBC 12 P:   Started rocephin, zithromax 10/04, day 6 of 7 PO azithro and keflex to start tomorrow to complete 7 day course, end dates added (10/10)  Oral Vanco x 14 days  ENDOCRINE A:   Hx of DM with renal complications and neuropathy. Hx of gout - acute flare R UE, 10/8 P:   SSI Restart januvia 10/9 Hold allopurinol, start 5 days of prednisone on 10/8  NEUROLOGIC A:   Acute metabolic encephalopathy, resolved. DM neuropathy. P:   Hold outpt neurontin until taking good PO  GLOBAL: Continue PT efforts Family declines pt admission to CIR, wants to rehab him at home.  Daughter reports strong family support.  They need a bedside commode, PT and aide if possible.   Plan for discharge home late tomorrow evening assuming no change in clinical status with C-Diff.   Canary BrimBrandi Ollis, NP-C [facility] Pgr: [phone] or [phone]   03/18/2014, 8:59 AM    STaff md  - personally evaluated patient. He did not voice any complaints. Very deconditioned. Would benefit from CIR but family feel  otherwise. He has C diff. Strt oral vanc x 14 days (due to high creat). Change ppi to H2 blockdaee. Due to his C diff he should come off keflex and axithro; scheduled stop date 03/19/14 but will stop keflex 03/18/14 today   Dr. Kalman ShanMurali Joelle Roswell, M.D., [facility] HospitalF.C.C.P Pulmonary and Critical Care Medicine Staff Physician [facility] [place] [facility] Pager: [phone], If no answer or between  15:00h - 7:00h: call [phone]  03/18/2014 12:39 PM

## 2014-03-18 NOTE — Progress Notes (Signed)
Rehab admissions - I met with pt and his daughter Danton Clap in follow up to rehab MD consult. I explained the possibility of inpatient rehab and dtr immediately stated that they are planning to bring pt home.  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 This is the third time he has been sick and we will get home health PT." Pt and dtr are hoping to be discharged to home later today.  I will now sign off pt's case. I notified Hassan Rowan, case Freight forwarder. Please call me with any questions. Thanks.  Nanetta Batty, PT Rehabilitation Admissions Coordinator [phone]

## 2014-03-18 NOTE — Progress Notes (Signed)
Medicare Important Message given? YES  (If response is "NO", the following Medicare IM given date fields will be blank)  Date Medicare IM given: 03/18/14 Medicare IM given by: Oliveras, Prabhjot Maddux  

## 2014-03-18 NOTE — Progress Notes (Addendum)
Pharmacy note: vancomycin  78 yo male with cdiff on po vancomycin and also noted on rocephin/aithromycin for tracheobronchitis (antibiotics to end 10/10).  WBC= 10.2, afeb, SCr= 1.96 (has ranged 1.96-2.68 in the last week and CrCl ~ 40).  10/9 vancomycin>>  Plan -No adjustment in vancomycin po needed; Would continue vancomycin 125mg  po qid for total of 14 days -Consider changing protonix to pepcid (patient noted on prilosec at home) -Will sign off for now. Please contact pharmacy with any other needs  Thank you, Harland Germanndrew Alcus Bradly, Pharm D 03/18/2014 11:51 AM

## 2014-03-18 NOTE — Consult Note (Signed)
Urology Consult  Consulting MD: Marchelle Gearingamaswamy  CC: Penile problems  HPI: This is a 78 year old male who is uncircumcised. [facility] and management of respiratory failure. He is now extubated. He apparently had a catheter placed prior to his transfer. The patient has had pain and swelling of his penis recently. Urologic consultation is requested.  The patient states that he has had some problems with scarring of his foreskin. He has had a difficult time in the past with retraction of his foreskin but has not needed urologic intervention.  PMH: Past Medical History  Diagnosis Date  . COPD (chronic obstructive pulmonary disease)   . Chronic respiratory failure   . CHF (congestive heart failure)   . Diabetes mellitus   . Anemia   . Colon polyp     PSH: Past Surgical History  Procedure Laterality Date  . Colonoscopy      Allergies: No Known Allergies  Medications: Prescriptions prior to admission  Medication Sig Dispense Refill  . acetaZOLAMIDE (DIAMOX) 500 MG capsule Take 500 mg by mouth every other day.      . albuterol (PROVENTIL) (5 MG/ML) 0.5% nebulizer solution Take 2.5 mg by nebulization every 8 (eight) hours.      Marland Kitchen. allopurinol (ZYLOPRIM) 300 MG tablet Take 300 mg by mouth daily.      Marland Kitchen. aspirin 81 MG tablet Take 81 mg by mouth daily.      . bisacodyl (BISACODYL) 5 MG EC tablet Take 5 mg by mouth daily.      . ferrous sulfate 325 (65 FE) MG tablet Take 325 mg by mouth daily with breakfast.      . furosemide (LASIX) 40 MG tablet Take 40 mg by mouth daily.      Marland Kitchen. gabapentin (NEURONTIN) 300 MG capsule Take 300 mg by mouth at bedtime.      . hydrALAZINE (APRESOLINE) 50 MG tablet Take 50 mg by mouth 2 (two) times daily.      . Multiple Vitamin (MULTIVITAMIN) capsule Take 1 capsule by mouth daily.      Marland Kitchen. omega-3 acid ethyl esters (LOVAZA) 1 G capsule Take by mouth 2 (two) times daily.      Marland Kitchen. omeprazole (PRILOSEC) 40 MG capsule  Take 40 mg by mouth daily.      . pravastatin (PRAVACHOL) 10 MG tablet Take 10 mg by mouth daily.      . sitaGLIPtin (JANUVIA) 50 MG tablet Take 50 mg by mouth daily.         Social History: History   Social History  . Marital Status: Married    Spouse Name: N/A    Number of Children: N/A  . Years of Education: N/A   Occupational History  . Not on file.   Social History Main Topics  . Smoking status: Former Games developermoker  . Smokeless tobacco: Not on file  . Alcohol Use: Not on file  . Drug Use: Not on file  . Sexual Activity: Not on file   Other Topics Concern  . Not on file   Social History Narrative  . No narrative on file    Family History: History reviewed. No pertinent family history.  Review of Systems: Positive: Penile pain and swelling Negative: .  A further 10 point review of systems was negative except what is listed in the HPI.  Physical Exam: @VITALS2 @ General: No acute distress.  Awake. Head:  Normocephalic.  Atraumatic. ENT:  EOMI.  Mucous membranes  moist Neck:  Supple.  No lymphadenopathy. Pulmonary: Equal effort bilaterally.  Abdomen: Soft.  None tender to palpation. Skin:  Normal turgor.  No visible rash. Extremity: No gross deformity of bilateral upper extremities.  No gross deformity of    bilateral lower extremities. Neurologic: Alert. Appropriate mood.  Penis:  Uncircumcised. There is paraphimosis present. There is a tight band at the distal foreskin, now proximal to the glanular ridge. There is significant glanular swelling. There is ulceration of the thigh moderate band. Urethra:  Foley catheter in place.  Orthotopic meatus. Scrotum: No lesions.  No ecchymosis.  No erythema. Testicles: Descended bilaterally.  No masses bilaterally. Epididymis: Palpable bilaterally.  Non Tender to palpation.  Studies:  Recent Labs     03/16/14  0235  HGB  7.6*  WBC  10.2  PLT  276    Recent Labs     03/16/14  0235  NA  147  K  3.8  CL  105  CO2   28  BUN  59*  CREATININE  1.96*  CALCIUM  8.7  GFRNONAA  31*  GFRAA  36*     No results found for this basename: PT, INR, APTT,  in the last 72 hours   No components found with this basename: ABG,     Assessment:  Paraphimosis due to lack of anatomic replacement of the foreskin following catheter placement.  Plan: After manual compression of the penis for several minutes, I was still unable to replace the patient's foreskin. I then performed an dorsal penile block with 20 mL of 1% lidocaine. After the block took effect, quite a bit of difficulty, I was able to reduce the paraphimosis.  I would recommend checking every shift to make sure that the foreskin is anatomically positioned over the end of the glans. Additionally, his foreskin and underneath of it should be cleansed with peroxide every shift. I will put that order in.    Pager:[phone]

## 2014-03-18 NOTE — Progress Notes (Signed)
Lab called to report patient positive for c-diff. MD paged, await return call. Emelda Brothershristy Alyas Creary RN

## 2014-03-18 NOTE — Progress Notes (Signed)
Pt having large loose incontinent stool, pt had flexiseal previously removed prior to arrival to [facility]. Sent stool for c-diff, awaiting results. Emelda Brothershristy Simmie Camerer RN

## 2014-03-19 LAB — GLUCOSE, CAPILLARY
GLUCOSE-CAPILLARY: 130 mg/dL — AB (ref 70–99)
Glucose-Capillary: 108 mg/dL — ABNORMAL HIGH (ref 70–99)

## 2014-03-19 NOTE — Progress Notes (Signed)
PULMONARY / CRITICAL CARE MEDICINE   Name: Douglas Rogers MRN: [id number] DOB: 1936-02-06    ADMISSION DATE:  03/11/2014 CONSULTATION DATE:  03/11/14   [facility]  CHIEF COMPLAINT:  SOB  INITIAL PRESENTATION: 78 yobm  With copd/ 02 dep at [facility] for [facility]B.  He was placed on BiPAP but failed and subsequently required intubation.  [facility] and PCCM was consulted for admission.  STUDIES:   SIGNIFICANT EVENTS: 10/2  [facility], intubated for respiratory failure, [facility]  fever, increased respiratory secretions 10/9 urology eval for paraphimosis requiring topical anesthesia to reposition foreskin/ foley dep for time being    SUBJECTIVE:  Pt wants to go home, not seen by urology this am, stools still soft but not runny   VITAL SIGNS: Temp:  [98 F (36.7 C)-99.4 F (37.4 C)] 99.4 F (37.4 C) (10/10 0610) Pulse Rate:  [81-99] 85 (10/10 0610) Resp:  [16-20] 18 (10/10 0610) BP: (138-172)/(47-69) 138/47 mmHg (10/10 0610) SpO2:  [95 %-100 %] 96 % (10/10 0918) Weight:  [229 lb 6.4 oz (104.055 kg)] 229 lb 6.4 oz (104.055 kg) (10/10 0610) Bipap over night on 6lpm/ 5 lpm at present   VENTILATOR SETTINGS:    INTAKE / OUTPUT: Intake/Output     10/09 0701 - 10/10 0700 10/10 0701 - 10/11 0700   P.O. 240    I.V. (mL/kg)     Total Intake(mL/kg) 240 (2.3)    Urine (mL/kg/hr) 750 (0.3)    Stool  1 (0)   Total Output 750 1   Net -510 -1        Stool Occurrence 1 x 1 x     PHYSICAL EXAMINATION: General: elderly male in no distress Neuro: awake and alert  HEENT: Op clear, no lesions, no jvd Cardiovascular: regular   Lungs: even/non-labored breathing, lungs bilaterally diminished, no wheeze Abdomen: soft, non tender Musculoskeletal: ankle edema Skin:  no rashes GU: penile edema, foley cath in place   LABS:  CBC  Recent Labs Lab 03/14/14 0526 03/15/14 0225 03/16/14 0235   WBC 11.9* 11.4* 10.2  HGB 7.7* 7.0* 7.6*  HCT 25.7* 23.7* 25.9*  PLT 256 268 276   BMET  Recent Labs Lab 03/14/14 0526 03/15/14 0225 03/16/14 0235  NA 139 145 147  K 3.1* 3.4* 3.8  CL 99 103 105  CO2 28 30 28   BUN 43* 61* 59*  CREATININE 2.18* 2.38* 1.96*  GLUCOSE 190* 144* 121*   Electrolytes  Recent Labs Lab 03/14/14 0526 03/15/14 0225 03/16/14 0235  CALCIUM 8.0* 8.0* 8.7  MG 2.2 2.5 2.5  PHOS 4.1 4.8* 4.0   ABG No results found for this basename: PHART, PCO2ART, PO2ART,  in the last 168 hours Glucose  Recent Labs Lab 03/17/14 2216 03/18/14 0749 03/18/14 1135 03/18/14 1709 03/18/14 2116 03/19/14 0818  GLUCAP 133* 112* 141* 119* 149* 108*     No results found for this basename: PROCALCITON,  in the last 168 hours  Imaging No results found.  ASSESSMENT / PLAN:  PULMONARY OETT 10/2 >>>10/6  A: Acute on chronic hypercarbic and hypoxemic respiratory failure. Hx of COPD on home oxygen. Presumed OSA/OHS.  P:    He has refused noct cpap in the past, has never had a sleep study.  Pt willing to have overnight oximetry in home.  [place] Pul[place]ologist [place]     F/u CXR as  needed Continue scheduled BD's for now Home oxygen as previously arranged   CARDIOVASCULAR A:  Chronic diastolic dysfunction with hx of HTN. Hyperlipidemia. - Echo ordered for 10/9 >>> P:  F/u Echo  Continue pravachol  D/c home diamox permanently  ASA Resume hydralazine   RENAL A:   CKD. Hypokalemia, resolved  Lab Results  Component Value Date   CREATININE 1.96* 03/16/2014   CREATININE 2.38* 03/15/2014   CREATININE 2.18* 03/14/2014    P:   Monitor urine outpt, renal fx, electrolytes Continue lasix  Trend BMETs  GASTROINTESTINAL / GU A:   Nutrition. Hx of GERD. Penile Edema - foreskin retraction & unable to reduce P:   Modified barium swallow 10/7- dysphagia 3 diet   Protonix for SUP Urology seeing since 10/9 but we need a specific plan  of care      HEMATOLOGIC A:   Anemia - chronic per report from daughter. P:  SCD's / Heparin. F/u CBC  INFECTIOUS A:   Tracheobronchitis 10/04. C-Diff   10/8>  POS  P:   Started rocephin, IV zithromax 10/04 >  10/8 d/c PO  zmax  started 10/9 to complete 7 day course, end dates added (10/10)  Oral Vanco started 10/9  x 14 days planned  ENDOCRINE A:   Hx of DM with renal complications and neuropathy. Hx of gout - acute flare R UE, 10/8 P:   SSI Restarted januvia 10/9 Hold allopurinol, started 5 days of prednisone on 10/8  NEUROLOGIC A:   Acute metabolic encephalopathy, resolved. DM neuropathy. P:   Hold outpt neurontin until taking good PO  GLOBAL: Continue PT efforts Family declines pt admission to CIR, wants to rehab him at home.  Daughter reports strong family support.  They need a bedside commode, PT and aide if possible.   Plan for discharge home when we clarify urology plan of care and either set up bipap or d/c it        Sandrea HughsMichael Tina Temme, MD Pulmonary and Critical Care Medicine [facility] Cell [phone] After 5:30 PM or weekends, call [phone]

## 2014-03-19 NOTE — Progress Notes (Signed)
Subjective: Patient reports that his penis is feeling better. He wonders if he needs a catheter.  Objective: Vital signs in last 24 hours: Temp:  [98 F (36.7 C)-99.4 F (37.4 C)] 99.4 F (37.4 C) (10/10 0610) Pulse Rate:  [81-99] 85 (10/10 0610) Resp:  [16-20] 18 (10/10 0610) BP: (138-172)/(47-69) 138/47 mmHg (10/10 0610) SpO2:  [95 %-100 %] 96 % (10/10 0918) Weight:  [104.055 kg (229 lb 6.4 oz)] 104.055 kg (229 lb 6.4 oz) (10/10 0610)  Intake/Output from previous day: 10/09 0701 - 10/10 0700 In: 240 [P.O.:240] Out: 750 [Urine:750] Intake/Output this shift: Total I/O In: -  Out: 1 [Stool:1]  Physical Exam:  Constitutional: Vital signs reviewed. WD WN in NAD   Eyes: PERRL, No scleral icterus.   Pulmonary/Chest: Normal effort Genitourinary: Foreskin is anatomically positioned. Minimal residual penile swelling. Catheter still in place.   Lab Results: No results found for this basename: HGB, HCT,  in the last 72 hours BMET No results found for this basename: NA, K, CL, CO2, GLUCOSE, BUN, CREATININE, CALCIUM,  in the last 72 hours No results found for this basename: LABPT, INR,  in the last 72 hours No results found for this basename: LABURIN,  in the last 72 hours Results for orders placed during the hospital encounter of 03/11/14  URINE CULTURE     Status: None   Collection Time    03/11/14  4:24 AM      Result Value Ref Range Status   Specimen Description URINE, CATHETERIZED   Final   Special Requests NONE   Final   Culture  Setup Time     Final   Value: 03/11/2014 08:53     Performed at [facility]   Colony Count     Final   Value: NO GROWTH     Performed at [facility]   Culture     Final   Value: NO GROWTH     Performed at [facility]   Report Status 03/12/2014 FINAL   Final  MRSA PCR SCREENING     Status: None   Collection Time    03/11/14  4:25 AM      Result Value Ref Range Status   MRSA by PCR NEGATIVE  NEGATIVE Final   Comment:            The GeneXpert MRSA Assay (FDA     approved for NASAL specimens     only), is one component of a     comprehensive MRSA colonization     surveillance program. It is not     intended to diagnose MRSA     infection nor to guide or     monitor treatment for     MRSA infections.  CULTURE, RESPIRATORY (NON-EXPECTORATED)     Status: None   Collection Time    03/13/14 12:24 PM      Result Value Ref Range Status   Specimen Description ENDOTRACHEAL   Final   Special Requests NONE   Final   Gram Stain     Final   Value: ABUNDANT WBC PRESENT, PREDOMINANTLY PMN     RARE SQUAMOUS EPITHELIAL CELLS PRESENT     ABUNDANT GRAM POSITIVE RODS     Performed at [facility]   Culture     Final   Value: Non-Pathogenic Oropharyngeal-type Flora Isolated.     Performed at [facility]   Report Status 03/15/2014 FINAL   Final  CLOSTRIDIUM DIFFICILE BY PCR  Status: Abnormal   Collection Time    03/17/14  7:06 PM      Result Value Ref Range Status   C difficile by pcr POSITIVE (*) NEGATIVE Final   Comment: CRITICAL RESULT CALLED TO, READ BACK BY AND VERIFIED WITH:     Adin HectorK. JOHNSON RN 9:45 03/18/14 ([email or web address])    Studies/Results: No results found.  Assessment/Plan:   Paraphimosis, properly reduced last night. I don't think he needs any other specific management of this other than, if the catheter is placed again, for the technician or nurse to properly replace the foreskin. Continue peroxide swabs while in the hospital. I would recommend that if he has problems [place] that he followup with the urologist there    Foley catheter in place-I don't think he needs this any further.   LOS: 8 days   Marcine MatarDAHLSTEDT, Gedeon Brandow M 03/19/2014, 12:16 PM

## 2014-03-19 NOTE — Progress Notes (Signed)
Patient discharged from unit Against Medical Advice. RN and charge RN educated the pt and daughter of the risks of discharging from the hospital AMA. Pt is alert and oriented x4. Both pt and daughter confirm understanding that the pt will not receive discharge instructions, prescriptions or follow up appointments by leaving AMA. Pt and both daughters left the unit via wheelchair, all declined assistance from staff to roll pt to the lobby from the unit. Pt discharged with a portable oxygen tank from home. T.Parret PA notified of pt's decision to leave hospital.

## 2014-03-20 NOTE — Progress Notes (Signed)
CARE MANAGEMENT NOTE 03/20/2014  Patient:  Douglas Rogers,Douglas Rogers   Account Number:  [id number]  Date Initiated:  03/16/2014  Documentation initiated by:  Junius CreamerWELL,DEBBIE  Subjective/Objective Assessment:   adm w resp failure-vent     Action/Plan:   lives w wife, pcp dr Ignacia Palmadavidson   Anticipated DC Date:  03/19/2014   Anticipated DC Plan:  HOME W HOME HEALTH SERVICES      DC Planning Services  CM consult      PAC Choice  DURABLE MEDICAL EQUIPMENT  HOME HEALTH   Choice offered to / List presented to:  C-4 Adult Children   DME arranged  [facility]  BIPAP      DME agency  [facility]     HH arranged  HH-2 PT  HH-4 NURSE'S AIDE      [facility]   Status of service:  Completed, signed off Medicare Important Message given?  YES (If response is "NO", the following Medicare IM given date fields will be blank) Date Medicare IM given:  03/17/2014 Medicare IM given by:  Junius CreamerWELL,DEBBIE Date Additional Medicare IM given:  03/18/2014 Additional Medicare IM given by:  Donn PieriniKRISTI Reynoldo Mainer  Discharge Disposition:  AGAINST MEDICAL ADVICE  Per UR Regulation:  Reviewed for med. necessity/level of care/duration of stay  If discussed at Long Length of Stay Meetings, dates discussed:   03/17/2014    Comments:  03/19/14 16:25 CM spoke with Douglas Rogers [phone] to discuss discharge needs.  Pt is on 4LNC here at the hospital [place]  F[place] Molelice states she is going to bring enough tanks to the hospital to get her father home with oxygen. CM explained to pt on a 3-4LNC flow rate - that is multiple tanks.  [place] Molelice states her sister can bring her father half way and then she can take hime therest of the way.   CM discussed it may be better to have an ambulance transport her father back home but F[place] Molelice states her father would never go for an ambulance back home.  CM spoke with pt who agrees to go home by ambulance. CM notifies Douglas Rogers who states DC  orders have not been written. CM speaks to Dr. Sherene SiresWert who states pt needs BiPaP and discharge is not safe at this time. CM notifies Douglas Rogers.  CM receives a call from Douglas Rogers (not Fulton Molelice but Rogers who is visiting at hospital) and I explain MD feels pt is not ready for discharge.  Rogers states she is coming back to hospital and she will tell her father. CM goes to unit to wait for Rogers.  When CM arrives, pt has requested to sign out AMA and charge and Douglas Rogers accomplishing this task with the pt. CM asks Rogers how will pt get back home with oxygen and she staes [place] Molelice is bringing the oxygen.   Tammy, PA calls and CM brings her up to date.  No other CM needs were communicated.  Douglas Rogers, Douglas Rogers, [place] [phone].   03/18/14- 1530- Donn PieriniKristi Jonai Weyland Douglas Rogers, Douglas Rogers [phone] Per pt'd daughter do not desire to go to [facility] but [facility]- [facility] for [facility] for [facility]-PT/aide along with DME for BIPAP and BSC- [facility]- [phone]-

## 2015-08-18 IMAGING — CR DG CHEST 1V PORT
1 series · 1 of 1 positions shown · non-contrast
Comparison: 03/11/2014

CLINICAL DATA: Acute respiratory failure.

EXAM:
PORTABLE CHEST - 1 VIEW

[AP]
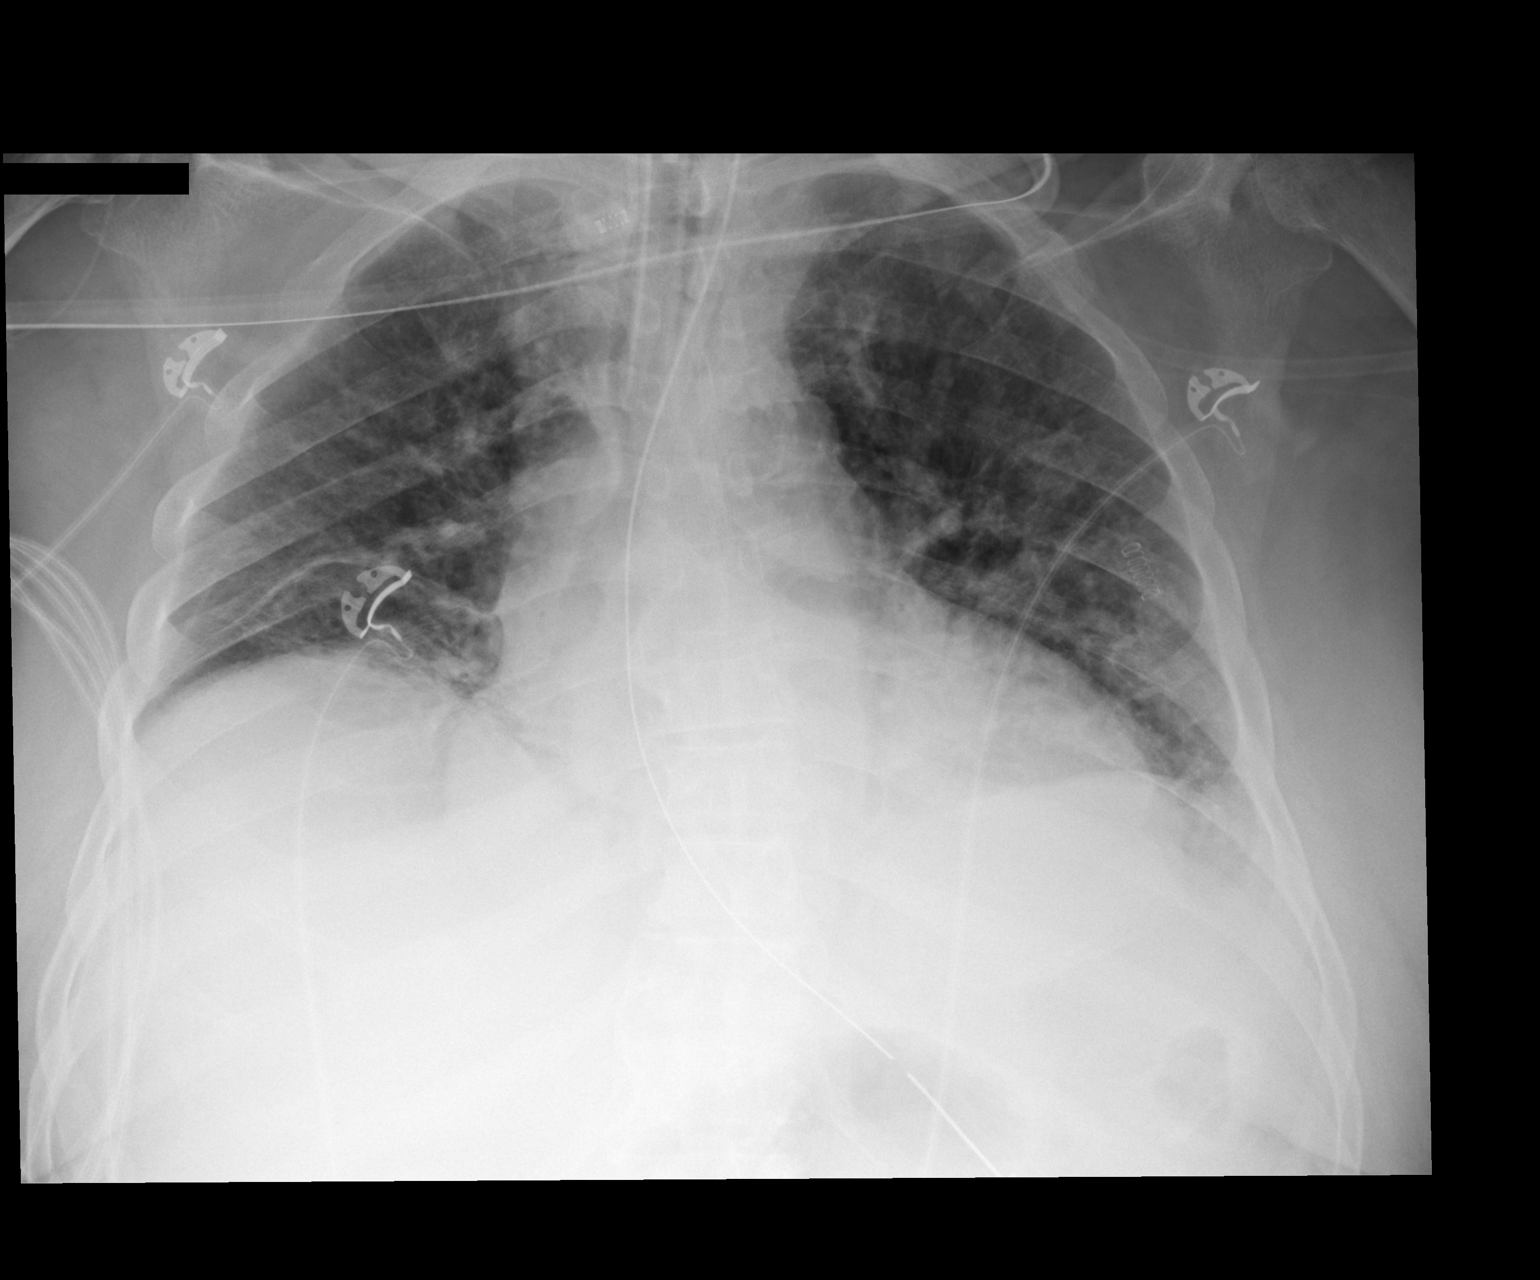

[1 of 1 positions shown; findings below may reference images not displayed]

FINDINGS: Endotracheal tube has been retracted, with tip now approximately 3
cm above the carina. Enteric tube courses into the left upper
abdomen with side hole projecting over the proximal stomach and tip
not imaged. There is persistent elevation of the right
hemidiaphragm. Lung volumes remain low with coarse bilateral
parenchymal opacities, not significantly changed. Linear atelectasis
in the right lung base is also unchanged. No pleural effusion or
pneumothorax is identified.
IMPRESSION: 1. Interval retraction of endotracheal tube as above.
2. Persistent low lung volumes with unchanged, course bilateral
parenchymal opacities and right basilar atelectasis.

## 2015-08-20 IMAGING — CR DG CHEST 1V PORT
1 series · 1 of 1 positions shown · non-contrast
Comparison: 03/12/2014.

CLINICAL DATA: COPD.

EXAM:
PORTABLE CHEST - 1 VIEW

[AP]
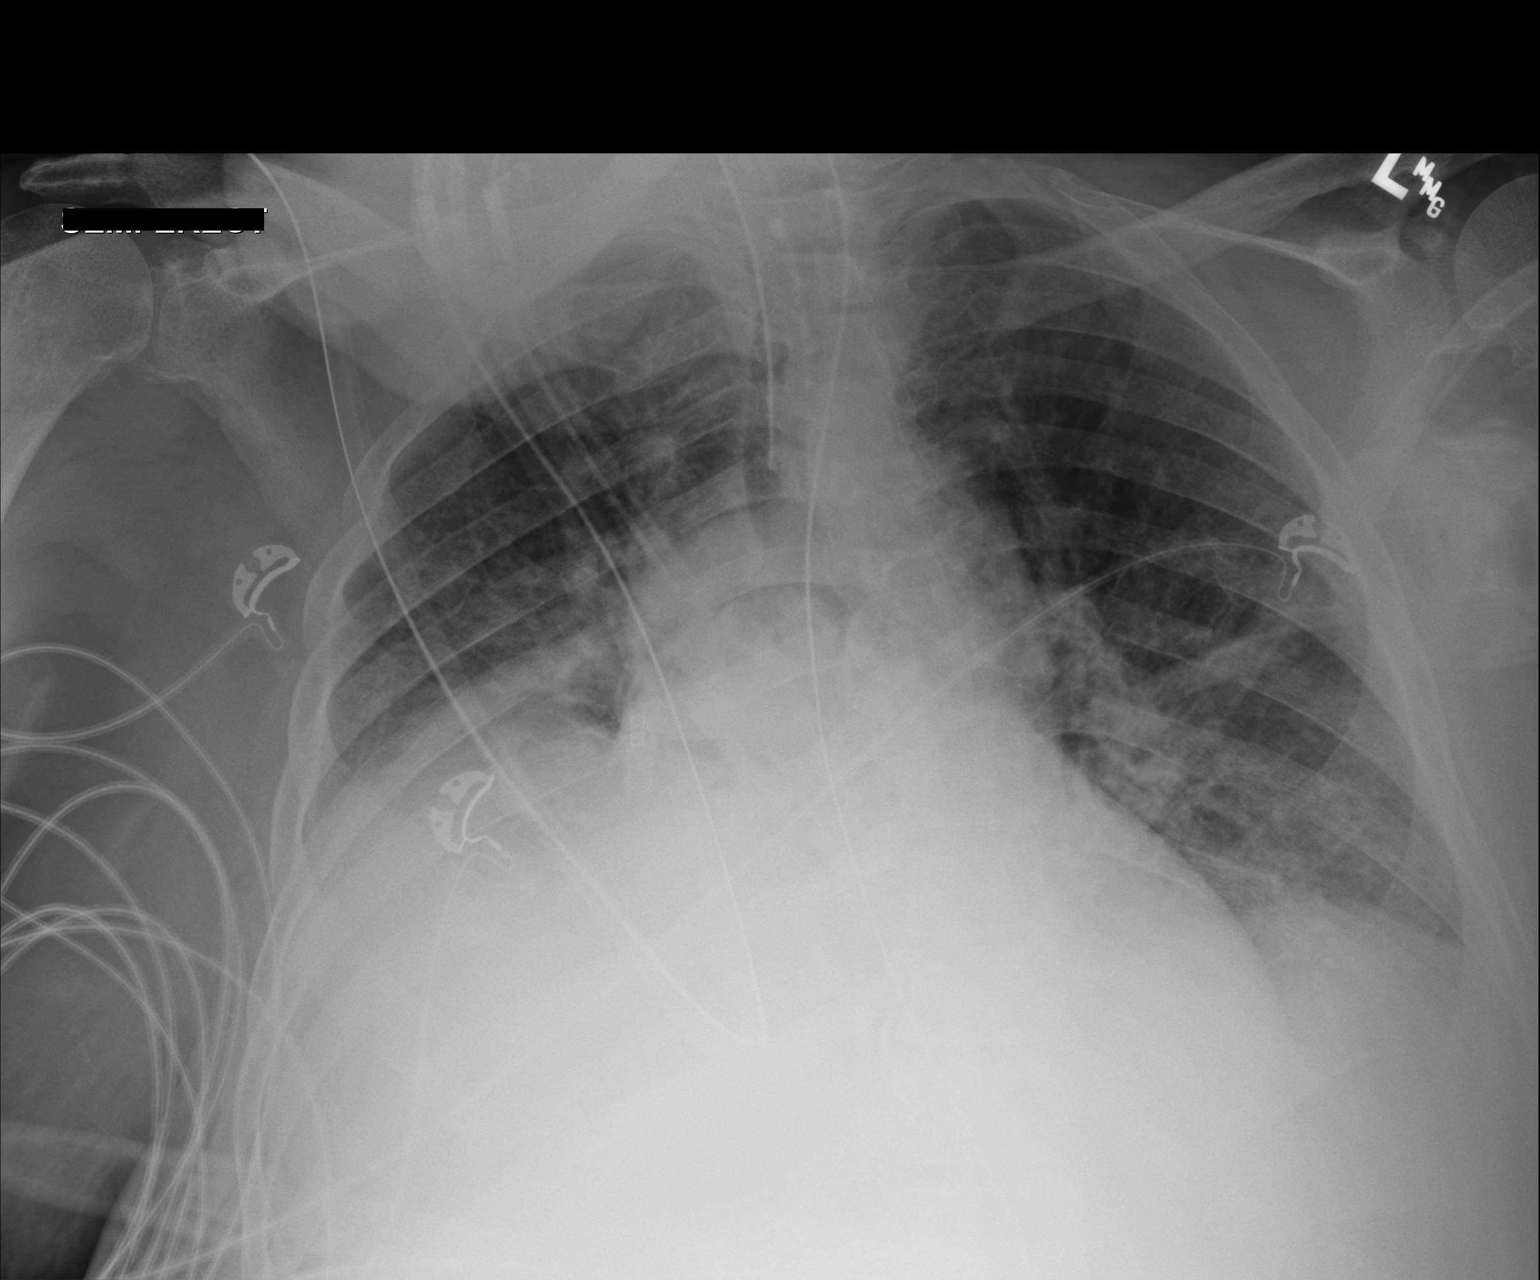

[1 of 1 positions shown; findings below may reference images not displayed]

FINDINGS: Endotracheal tube terminates 3.4 cm above the carina. Nasogastric
tube is followed into the stomach. Heart is enlarged, stable. Lungs
are somewhat low in volume with mixed interstitial and airspace
disease, basilar dependent. Findings are stable to minimally
worsened from 03/12/2014. Right hemidiaphragm is elevated. No
definite pleural fluid.
IMPRESSION: Mixed interstitial and airspace disease, basilar dependent, stable
or slightly progressive from 03/12/2014 and likely due to pulmonary
edema.

## 2015-08-22 IMAGING — CR DG CHEST 1V PORT
1 series · 1 of 1 positions shown · non-contrast
Comparison: 03/14/2014.

CLINICAL DATA: Respiratory failure.  Shortness of breath.

EXAM:
PORTABLE CHEST - 1 VIEW

[AP]
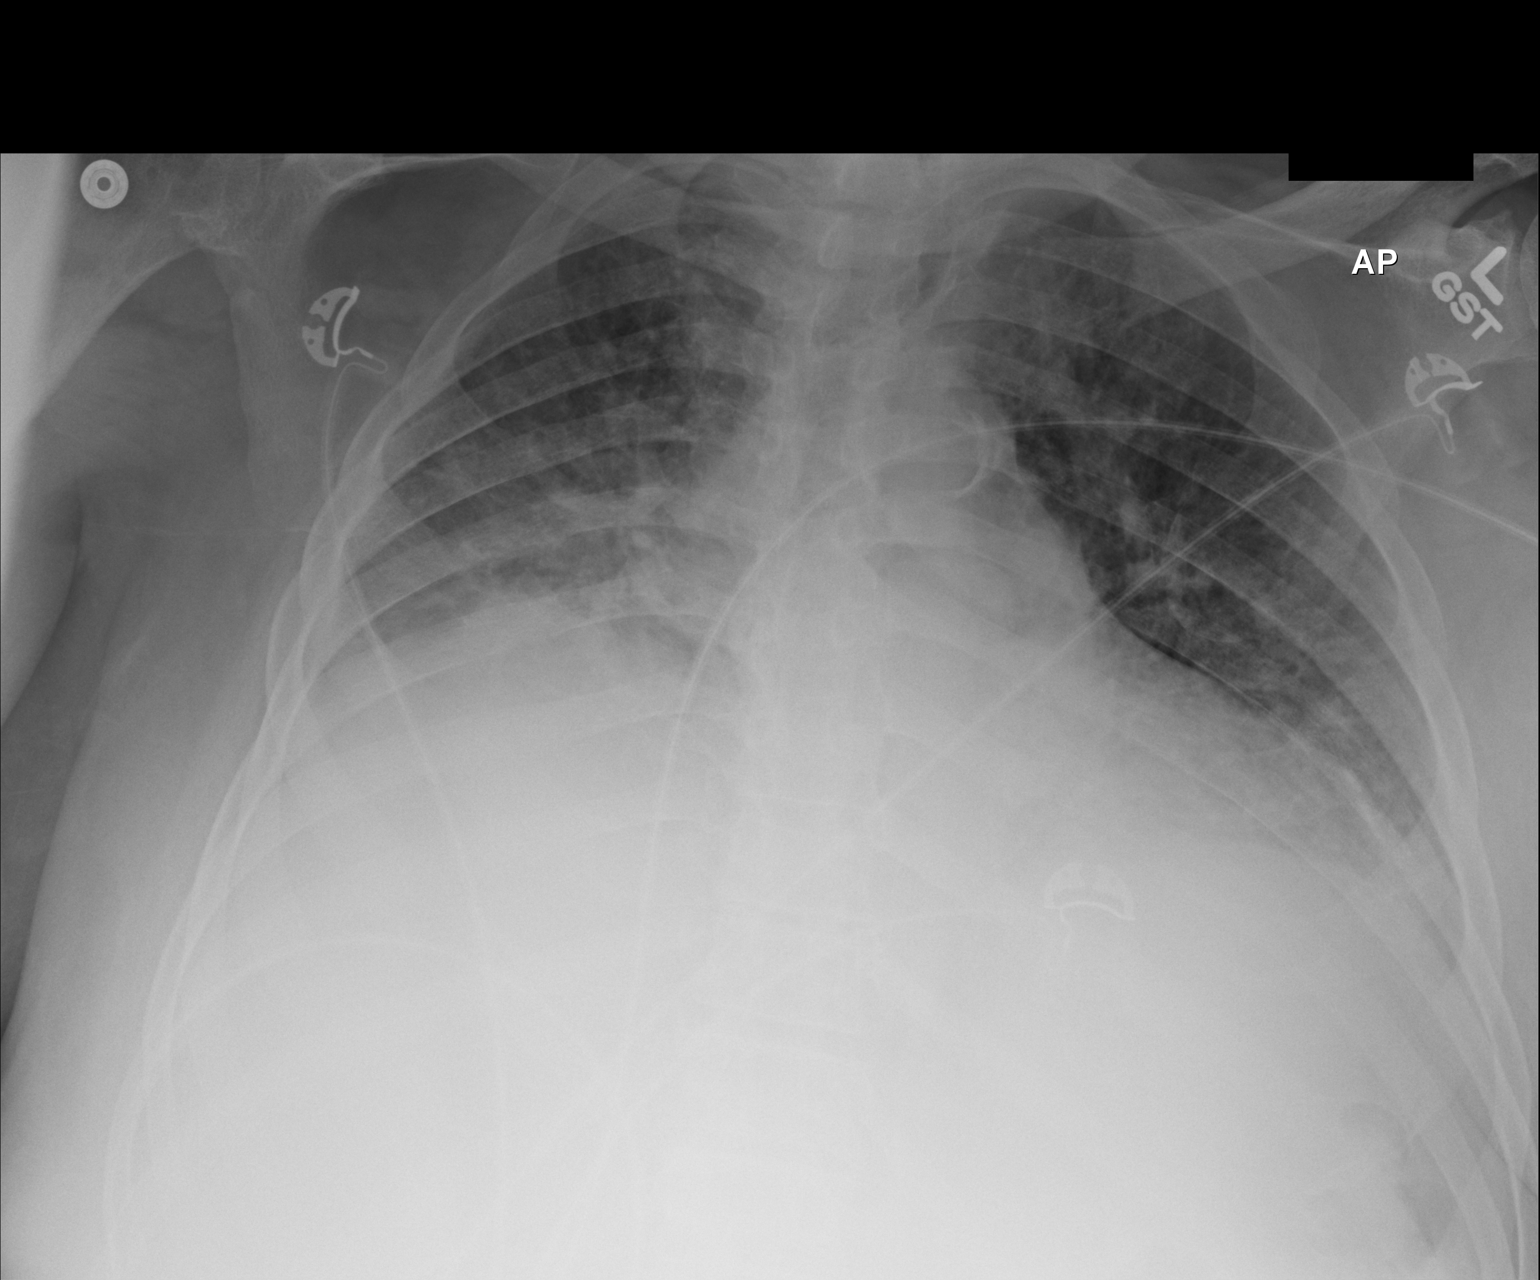

[1 of 1 positions shown; findings below may reference images not displayed]

FINDINGS: There has been interval removal of the enteric tube and nasogastric
tube. The lung volumes are low. There are persistent small pleural
effusions and mild interstitial edema.
IMPRESSION: 1. No change in CHF pattern.

## 2015-09-09 DEATH — deceased
